# Patient Record
Sex: Male | Born: 1955 | Hispanic: Yes | Marital: Married | Smoking: Former smoker
Health system: Southern US, Community
[De-identification: ages and names within clinical notes are randomized; demographics above are authoritative.]

## PROBLEM LIST (undated history)

## (undated) DIAGNOSIS — M170 Bilateral primary osteoarthritis of knee: Secondary | ICD-10-CM

## (undated) HISTORY — PX: KNEE SURGERY: SHX244

---

## 2019-04-08 ENCOUNTER — Encounter (HOSPITAL_COMMUNITY): Payer: Self-pay

## 2019-04-08 ENCOUNTER — Emergency Department (HOSPITAL_COMMUNITY): Payer: Self-pay

## 2019-04-08 ENCOUNTER — Other Ambulatory Visit: Payer: Self-pay

## 2019-04-08 ENCOUNTER — Inpatient Hospital Stay (HOSPITAL_COMMUNITY)
Admission: EM | Admit: 2019-04-08 | Discharge: 2019-04-14 | DRG: 872 | Disposition: A | Discharge status: Home Health | Payer: Self-pay | Attending: Family Medicine | Admitting: Family Medicine

## 2019-04-08 DIAGNOSIS — M729 Fibroblastic disorder, unspecified: Secondary | ICD-10-CM | POA: Diagnosis present

## 2019-04-08 DIAGNOSIS — M1990 Unspecified osteoarthritis, unspecified site: Secondary | ICD-10-CM | POA: Diagnosis present

## 2019-04-08 DIAGNOSIS — Z1159 Encounter for screening for other viral diseases: Secondary | ICD-10-CM

## 2019-04-08 DIAGNOSIS — D649 Anemia, unspecified: Secondary | ICD-10-CM | POA: Diagnosis present

## 2019-04-08 DIAGNOSIS — R748 Abnormal levels of other serum enzymes: Secondary | ICD-10-CM | POA: Diagnosis present

## 2019-04-08 DIAGNOSIS — Z945 Skin transplant status: Secondary | ICD-10-CM

## 2019-04-08 DIAGNOSIS — M609 Myositis, unspecified: Secondary | ICD-10-CM | POA: Diagnosis present

## 2019-04-08 DIAGNOSIS — L538 Other specified erythematous conditions: Secondary | ICD-10-CM

## 2019-04-08 DIAGNOSIS — T63301A Toxic effect of unspecified spider venom, accidental (unintentional), initial encounter: Secondary | ICD-10-CM | POA: Diagnosis present

## 2019-04-08 DIAGNOSIS — I96 Gangrene, not elsewhere classified: Secondary | ICD-10-CM | POA: Diagnosis present

## 2019-04-08 DIAGNOSIS — L03115 Cellulitis of right lower limb: Secondary | ICD-10-CM | POA: Diagnosis present

## 2019-04-08 DIAGNOSIS — Z87891 Personal history of nicotine dependence: Secondary | ICD-10-CM

## 2019-04-08 DIAGNOSIS — M7989 Other specified soft tissue disorders: Secondary | ICD-10-CM

## 2019-04-08 DIAGNOSIS — A419 Sepsis, unspecified organism: Principal | ICD-10-CM | POA: Diagnosis present

## 2019-04-08 HISTORY — DX: Bilateral primary osteoarthritis of knee: M17.0

## 2019-04-08 LAB — COMPREHENSIVE METABOLIC PANEL
ALT: 61 U/L — ABNORMAL HIGH (ref 0–44)
AST: 66 U/L — ABNORMAL HIGH (ref 15–41)
Albumin: 3.3 g/dL — ABNORMAL LOW (ref 3.5–5.0)
Alkaline Phosphatase: 154 U/L — ABNORMAL HIGH (ref 38–126)
Anion gap: 9 (ref 5–15)
BUN: 37 mg/dL — ABNORMAL HIGH (ref 8–23)
CO2: 26 mmol/L (ref 22–32)
Calcium: 8.5 mg/dL — ABNORMAL LOW (ref 8.9–10.3)
Chloride: 103 mmol/L (ref 98–111)
Creatinine, Ser: 1.01 mg/dL (ref 0.61–1.24)
GFR calc Af Amer: >60 mL/min (ref >60)
GFR calc non Af Amer: >60 mL/min (ref >60)
Glucose, Bld: 92 mg/dL (ref 70–99)
Potassium: 3.9 mmol/L (ref 3.5–5.1)
Sodium: 138 mmol/L (ref 135–145)
Total Bilirubin: 0.6 mg/dL (ref 0.3–1.2)
Total Protein: 7.7 g/dL (ref 6.5–8.1)

## 2019-04-08 LAB — CBC WITH DIFFERENTIAL/PLATELET
Abs Immature Granulocytes: 0.26 10*3/uL — ABNORMAL HIGH (ref 0.00–0.07)
Basophils Absolute: 0.2 10*3/uL — ABNORMAL HIGH (ref 0.0–0.1)
Basophils Relative: 0 %
Eosinophils Absolute: 0 10*3/uL (ref 0.0–0.5)
Eosinophils Relative: 0 %
HCT: 41.2 % (ref 39.0–52.0)
Hemoglobin: 13.2 g/dL (ref 13.0–17.0)
Immature Granulocytes: 1 %
Lymphocytes Relative: 2 %
Lymphs Abs: 0.8 10*3/uL (ref 0.7–4.0)
MCH: 29.9 pg (ref 26.0–34.0)
MCHC: 32 g/dL (ref 30.0–36.0)
MCV: 93.2 fL (ref 80.0–100.0)
Monocytes Absolute: 1.3 10*3/uL — ABNORMAL HIGH (ref 0.1–1.0)
Monocytes Relative: 4 %
Neutro Abs: 34.6 10*3/uL — ABNORMAL HIGH (ref 1.7–7.7)
Neutrophils Relative %: 93 %
Platelets: 348 10*3/uL (ref 150–400)
RBC: 4.42 MIL/uL (ref 4.22–5.81)
RDW: 14.6 % (ref 11.5–15.5)
WBC: 37.2 10*3/uL — ABNORMAL HIGH (ref 4.0–10.5)
nRBC: 0 % (ref 0.0–0.2)

## 2019-04-08 LAB — LACTIC ACID, PLASMA: Lactic Acid, Venous: 1.5 mmol/L (ref 0.5–1.9)

## 2019-04-08 LAB — SARS CORONAVIRUS 2 BY RT PCR (HOSPITAL ORDER, PERFORMED IN ~~LOC~~ HOSPITAL LAB): SARS Coronavirus 2: NEGATIVE

## 2019-04-08 MED ORDER — VANCOMYCIN HCL IN DEXTROSE 750-5 MG/150ML-% IV SOLN
750.0000 mg | Freq: Two times a day (BID) | INTRAVENOUS | Status: DC
  Administered 2019-04-09 – 2019-04-12 (×7): 750 mg via INTRAVENOUS
  Filled 2019-04-08 (×7): qty 150

## 2019-04-08 MED ORDER — SODIUM CHLORIDE 0.9 % IV SOLN
INTRAVENOUS | Status: AC
  Administered 2019-04-08: 22:00:00 via INTRAVENOUS

## 2019-04-08 MED ORDER — VANCOMYCIN HCL 10 G IV SOLR
1250.0000 mg | Freq: Once | INTRAVENOUS | Status: AC
  Administered 2019-04-08: 1250 mg via INTRAVENOUS
  Filled 2019-04-08: qty 1250

## 2019-04-08 MED ORDER — ONDANSETRON HCL 4 MG PO TABS: 4.0000 mg | ORAL_TABLET | Freq: Four times a day (QID) | ORAL | Status: DC | PRN

## 2019-04-08 MED ORDER — OXYCODONE HCL 5 MG PO TABS
5.0000 mg | ORAL_TABLET | ORAL | Status: DC | PRN
  Administered 2019-04-08 – 2019-04-14 (×4): 5 mg via ORAL
  Filled 2019-04-08 (×4): qty 1

## 2019-04-08 MED ORDER — ONDANSETRON HCL 4 MG/2ML IJ SOLN: 4.0000 mg | Freq: Four times a day (QID) | INTRAMUSCULAR | Status: DC | PRN

## 2019-04-08 MED ORDER — PIPERACILLIN-TAZOBACTAM 3.375 G IVPB
3.3750 g | Freq: Three times a day (TID) | INTRAVENOUS | Status: DC
  Administered 2019-04-09 – 2019-04-13 (×14): 3.375 g via INTRAVENOUS
  Filled 2019-04-08 (×14): qty 50

## 2019-04-08 MED ORDER — ACETAMINOPHEN 325 MG PO TABS
650.0000 mg | ORAL_TABLET | Freq: Four times a day (QID) | ORAL | Status: DC | PRN
  Administered 2019-04-09 – 2019-04-12 (×3): 650 mg via ORAL
  Filled 2019-04-08 (×3): qty 2

## 2019-04-08 MED ORDER — ACETAMINOPHEN 650 MG RE SUPP: 650.0000 mg | Freq: Four times a day (QID) | RECTAL | Status: DC | PRN

## 2019-04-08 MED ORDER — SODIUM CHLORIDE 0.9 % IV BOLUS
1000.0000 mL | Freq: Once | INTRAVENOUS | Status: AC
  Administered 2019-04-08: 15:00:00 1000 mL via INTRAVENOUS

## 2019-04-08 MED ORDER — GADOBUTROL 1 MMOL/ML IV SOLN
6.0000 mL | Freq: Once | INTRAVENOUS | Status: AC | PRN
  Administered 2019-04-08: 17:00:00 6 mL via INTRAVENOUS

## 2019-04-08 MED ORDER — PIPERACILLIN-TAZOBACTAM 3.375 G IVPB 30 MIN
3.3750 g | INTRAVENOUS | Status: AC
  Administered 2019-04-08: 3.375 g via INTRAVENOUS
  Filled 2019-04-08: qty 50

## 2019-04-08 NOTE — ED Notes (Signed)
Pt has IV from EMS that flushes well but unable to draw back blood. 2 unsuccessful attempts to obtain second IV.

## 2019-04-08 NOTE — ED Notes (Signed)
Vascular at beside.

## 2019-04-08 NOTE — ED Notes (Signed)
Called transport a second time, was told she would be here in a few

## 2019-04-08 NOTE — ED Triage Notes (Signed)
Pt BIB EMS from home. Per EMS spider bite on right leg occurred on Saturday. Pts leg is bright red and there is necrotic tissue. Pt is Spanish speaking.

## 2019-04-08 NOTE — ED Notes (Signed)
Patient transported to MRI 

## 2019-04-08 NOTE — Progress Notes (Signed)
Pharmacy Antibiotic Note  Evan Gray is a 63 y.o. male admitted on 04/08/2019 with wound infection.  Pharmacy has been consulted for zosyn and vancomycin dosing.  Plan: Zosyn 3.375g IV q8h (4 hour infusion).  Vancomycin 1250 mg x1 then 750 mg IV q12h for est AUC = 512 Goal AUC = 400-550 Daily scr F/u cultures/levels  Height: 5\' 6"  (167.6 cm) Weight: 150 lb (68 kg) IBW/kg (Calculated) : 63.8  Temp (24hrs), Avg:97.8 F (36.6 C), Min:97.8 F (36.6 C), Max:97.8 F (36.6 C)  Recent Labs  Lab 04/08/19 1437  WBC 37.2*  CREATININE 1.01  LATICACIDVEN 1.5    Estimated Creatinine Clearance: 66.7 mL/min (by C-G formula based on SCr of 1.01 mg/dL).    No Known Allergies  Antimicrobials this admission: 6/25 zosyn >>  6/25 vancomycin >>   Dose adjustments this admission:   Microbiology results:  BCx:   UCx:    Sputum:    MRSA PCR:   Thank you for allowing pharmacy to be a part of this patient's care.  Dorrene German 04/08/2019 9:43 PM

## 2019-04-08 NOTE — ED Notes (Signed)
Patient transported to X-ray 

## 2019-04-08 NOTE — Progress Notes (Signed)
A consult was received from an ED physician for vancomycin per pharmacy dosing.  The patient's profile has been reviewed for ht/wt/allergies/indication/available labs.    A one time order has been placed for vancomycin 1250 mg IV x1 .  Further antibiotics/pharmacy consults should be ordered by admitting physician if indicated.                       Thank you, Lynelle Doctor 04/08/2019  1:34 PM

## 2019-04-08 NOTE — ED Provider Notes (Signed)
Care assumed from [facility], patient initially cared for by PA Katherine MantleBrenda Morelli, please see his note for full details, but in brief Evan CanterburyValera Gray is a 63 y.o. male presenting with cellulitis and wound to the right lower extremity after suspected insect bite.  Patient with significant leukocytosis, no acute electrolyte derangements, LFTs slightly elevated, normal lactic.  X-ray of the right lower extremity and DVT study were negative, MRI ordered by previous provider which is pending.  Patient has been given IV fluids and vancomycin.  Plan: Follow-up on MRI results, plan for admission.  Physical Exam  BP 110/71    Pulse 80    Temp 97.8 F (36.6 C) (Oral)    Resp (!) 24    Ht 5\' 6"  (1.676 m)    Wt 68 kg    SpO2 97%    BMI 24.21 kg/m   Physical Exam       ED Course/Procedures   Labs Reviewed  CBC WITH DIFFERENTIAL/PLATELET - Abnormal; Notable for the following components:      Result Value   WBC 37.2 (*)    Neutro Abs 34.6 (*)    Monocytes Absolute 1.3 (*)    Basophils Absolute 0.2 (*)    Abs Immature Granulocytes 0.26 (*)    All other components within normal limits  COMPREHENSIVE METABOLIC PANEL - Abnormal; Notable for the following components:   BUN 37 (*)    Calcium 8.5 (*)    Albumin 3.3 (*)    AST 66 (*)    ALT 61 (*)    Alkaline Phosphatase 154 (*)    All other components within normal limits  CULTURE, BLOOD (ROUTINE X 2)  CULTURE, BLOOD (ROUTINE X 2)  SARS CORONAVIRUS 2 (HOSPITAL ORDER, PERFORMED IN [facility])  LACTIC ACID, PLASMA    Dg Tibia/fibula Right  Result Date: 04/08/2019 CLINICAL DATA:  Pain and swelling. EXAM: RIGHT TIBIA AND FIBULA - 2 VIEW COMPARISON:  None. FINDINGS: Advanced degenerative changes at the knee joint are noted. Evidence of remote fractures of the tibia and fibula with prior hardware removal. No acute bony findings, destructive bony changes or radiopaque foreign body. IMPRESSION: Degenerative changes and evidence of  remote trauma and surgery. No acute findings. Electronically Signed   By: Rudie MeyerP.  Gallerani M.D.   On: 04/08/2019 13:56   Mr Tibia Fibula Right W Wo Contrast  Result Date: 04/08/2019 CLINICAL DATA:  Lower leg redness and swelling after spider bite. EXAM: MRI OF LOWER RIGHT EXTREMITY WITHOUT AND WITH CONTRAST TECHNIQUE: Multiplanar, multisequence MR imaging of the right tibia and fibula was performed both before and after administration of intravenous contrast. CONTRAST:  6 mL Gadavist intravenous contrast. COMPARISON:  Right tibia and fibula x-rays from same day. FINDINGS: Bones/Joint/Cartilage No suspicious marrow signal abnormality. Advanced bilateral knee osteoarthritis, most severe in the right lateral compartment, with prominent subchondral marrow edema. Multiple intra-articular bodies in the posterior right knee joint space. No acute fracture or dislocation. Old healed fractures of the right proximal tibial metaphysis and proximal fibular diaphysis. Normal alignment. Small left and trace right knee joint effusions. Muscles and Tendons Edema and enhancement primarily involving the medial gastrocnemius muscle, and to lesser extent the lateral gastrocnemius muscle and anterior aspect of the soleus muscle. Prominent inter fascial fluid between the medial gastrocnemius and soleus muscles extending superiorly into the popliteal fossa. Soft tissue Extensive diffuse soft tissue swelling and enhancement of the lower leg. There are patchy areas of non-enhancement along the medial distal lower leg (  series 10, image 41), consistent with devitalized tissue. No discrete rim enhancing fluid collection. No soft tissue mass. IMPRESSION: 1. Extensive cellulitis of the right lower leg with underlying myofasciitis involving the superficial posterior compartment. Fascial fluid extends superiorly into the popliteal fossa. Areas of devitalized superficial soft tissue along the medial lower leg without discrete abscess. 2. No  evidence of osteomyelitis. 3. Advanced bilateral knee osteoarthritis. Electronically Signed   By: Obie DredgeWilliam T Derry M.D.   On: 04/08/2019 17:44   Vas Koreas Lower Extremity Venous (dvt) (only Mc & [facility])  Result Date: 04/08/2019  Lower Venous Study Indications: Right sided Swelling, Erythema, and Post spider bite 5 days ago. Necrotic tissue in the calf area.  Limitations: Open wound. Comparison Study: No previous exam available Performing Technologist: Toma DeitersVirginia Slaughter RVS  Examination Guidelines: A complete evaluation includes B-mode imaging, spectral Doppler, color Doppler, and power Doppler as needed of all accessible portions of each vessel. Bilateral testing is considered an integral part of a complete examination. Limited examinations for reoccurring indications may be performed as noted.  +---------+---------------+---------+-----------+----------+-------------------+  RIGHT     Compressibility Phasicity Spontaneity Properties Summary              +---------+---------------+---------+-----------+----------+-------------------+  CFV       Full            Yes       Yes                                         +---------+---------------+---------+-----------+----------+-------------------+  SFJ       Full                                                                  +---------+---------------+---------+-----------+----------+-------------------+  FV Prox   Full            Yes       Yes                                         +---------+---------------+---------+-----------+----------+-------------------+  FV Mid    Full                                                                  +---------+---------------+---------+-----------+----------+-------------------+  FV Distal Full            Yes       Yes                                         +---------+---------------+---------+-----------+----------+-------------------+  PFV       Full            Yes       Yes                                          +---------+---------------+---------+-----------+----------+-------------------+  POP       Full            Yes       Yes                                         +---------+---------------+---------+-----------+----------+-------------------+  PTV       Full                                                                  +---------+---------------+---------+-----------+----------+-------------------+  PERO      Full                                             Unable to visualize                                                              the proximal region  +---------+---------------+---------+-----------+----------+-------------------+   Right Technical Findings: Not visualized segments include proximal PTV and proximal Peroneal. Unable to visualize due to open wound in the mid calf at the site of the necrotic tissue.  +----+---------------+---------+-----------+----------+-------+  LEFT Compressibility Phasicity Spontaneity Properties Summary  +----+---------------+---------+-----------+----------+-------+  CFV  Full            Yes       Yes                             +----+---------------+---------+-----------+----------+-------+  SFJ  Full                                                      +----+---------------+---------+-----------+----------+-------+     Summary: Right: There is no evidence of deep vein thrombosis in the lower extremity. However, portions of this examination were limited- see technical findings listed above. Left: There is no evidence of a common femoral vein obstruction.  *See table(s) above for measurements and observations.    Preliminary      Procedures  MDM   MRI shows extensive cellulitis of the right lower leg with underlying myofasciitis involving the superficial posterior compartment. Fascial fluid extends superiorly into the popliteal fossa. Areas of devitalized superficial soft tissue along the medial lower leg without discrete abscess. No evidence of osteomyelitis.  I  discussed these results with Dr. Nelia Shi, radiologist who read images and he does express concern for deeper infection within the muscle and fascia, will discuss with orthopedics for possible surgical intervention.  Case discussed with Dr. Erlinda Hong with orthopedics who reviewed patient's images, does not feel that patient needs acute surgical intervention at this time, recommends hospitalist admission continued IV antibiotics, if patient is not responding to antibiotics, orthopedics can be reconsulted at that  time.  Case discussed with Dr. Allena KatzPatel with [facility] hospitalist who will see and admit the patient.  Final diagnoses:  Cellulitis of right lower extremity  Myofasciitis         Legrand RamsFord, Derhonda Eastlick N, PA-C 04/08/19 1913    Vanetta MuldersZackowski, Scott, MD 04/11/19 [phone]
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
## 2019-04-08 NOTE — Progress Notes (Signed)
Right lower extremity venous duplex completed. Preliminary results in Chart review CV Proc. [facility], [place]  04/08/2019, 3:22 PM

## 2019-04-08 NOTE — ED Notes (Signed)
Pt back from MRI 

## 2019-04-08 NOTE — ED Notes (Signed)
ED TO INPATIENT HANDOFF REPORT  Name/Age/Gender Evan Gray 63 y.o. male  Code Status   Home/SNF/Other Home  Chief Complaint Spider Bite  Level of Care/Admitting Diagnosis ED Disposition    ED Disposition Condition [facility] Area: [place] [[id number]]  Level of Care: Med-Surg [16]  Covid Evaluation: Confirmed COVID Negative  Diagnosis: Cellulitis of right lower extremity [563149]  Admitting Physician: Lenore Cordia [[id number]]  Attending Physician: Lenore Cordia [[id number]]  Estimated length of stay: past midnight tomorrow  Certification:: I certify this patient will need inpatient services for at least 2 midnights  PT Class (Do Not Modify): Inpatient [101]  PT Acc Code (Do Not Modify): Private [1]       Medical History Past Medical History:  Diagnosis Date  . Osteoarthritis of knees, bilateral     Allergies No Known Allergies  IV Location/Drains/Wounds Patient Lines/Drains/Airways Status   Active Line/Drains/Airways    Name:   Placement date:   Placement time:   Site:   Days:   Peripheral IV 04/08/19 Right Forearm   04/08/19    1436    Forearm   less than 1   Peripheral IV 04/08/19 Right;Upper Arm   04/08/19    1441    Arm   less than 1          Labs/Imaging Results for orders placed or performed during the hospital encounter of 04/08/19 (from the past 48 hour(s))  CBC with Differential     Status: Abnormal   Collection Time: 04/08/19  2:37 PM  Result Value Ref Range   WBC 37.2 (H) 4.0 - 10.5 K/uL   RBC 4.42 4.22 - 5.81 MIL/uL   Hemoglobin 13.2 13.0 - 17.0 g/dL   HCT 41.2 39.0 - 52.0 %   MCV 93.2 80.0 - 100.0 fL   MCH 29.9 26.0 - 34.0 pg   MCHC 32.0 30.0 - 36.0 g/dL   RDW 14.6 11.5 - 15.5 %   Platelets 348 150 - 400 K/uL   nRBC 0.0 0.0 - 0.2 %   Neutrophils Relative % 93 %   Neutro Abs 34.6 (H) 1.7 - 7.7 K/uL   Lymphocytes Relative 2 %   Lymphs Abs 0.8 0.7 - 4.0 K/uL   Monocytes Relative 4 %   Monocytes  Absolute 1.3 (H) 0.1 - 1.0 K/uL   Eosinophils Relative 0 %   Eosinophils Absolute 0.0 0.0 - 0.5 K/uL   Basophils Relative 0 %   Basophils Absolute 0.2 (H) 0.0 - 0.1 K/uL   Immature Granulocytes 1 %   Abs Immature Granulocytes 0.26 (H) 0.00 - 0.07 K/uL    Comment: Performed at [facility], [place] [address], [place], [place] [address]  Comprehensive metabolic panel     Status: Abnormal   Collection Time: 04/08/19  2:37 PM  Result Value Ref Range   Sodium 138 135 - 145 mmol/L   Potassium 3.9 3.5 - 5.1 mmol/L   Chloride 103 98 - 111 mmol/L   CO2 26 22 - 32 mmol/L   Glucose, Bld 92 70 - 99 mg/dL   BUN 37 (H) 8 - 23 mg/dL   Creatinine, Ser 1.01 0.61 - 1.24 mg/dL   Calcium 8.5 (L) 8.9 - 10.3 mg/dL   Total Protein 7.7 6.5 - 8.1 g/dL   Albumin 3.3 (L) 3.5 - 5.0 g/dL   AST 66 (H) 15 - 41 U/L   ALT 61 (H) 0 - 44 U/L   Alkaline  Phosphatase 154 (H) 38 - 126 U/L   Total Bilirubin 0.6 0.3 - 1.2 mg/dL   GFR calc non Af Amer >60 >60 mL/min   GFR calc Af Amer >60 >60 mL/min   Anion gap 9 5 - 15    Comment: Performed at [facility], [address] [place], [place] [address]  Lactic acid, plasma     Status: None   Collection Time: 04/08/19  2:37 PM  Result Value Ref Range   Lactic Acid, Venous 1.5 0.5 - 1.9 mmol/L    Comment: Performed at [facility], [address] [place], [place] [address]  Blood culture (routine x 2)     Status: None (Preliminary result)   Collection Time: 04/08/19  2:39 PM   Specimen: BLOOD RIGHT FOREARM  Result Value Ref Range   Specimen Description      BLOOD RIGHT FOREARM Performed at [facility], [address] [place], [place] [address]    Special Requests      BOTTLES DRAWN AEROBIC AND ANAEROBIC Blood Culture adequate volume Performed at [facility], [address] [place], [place] [address]    Culture PENDING    Report Status PENDING   SARS Coronavirus 2 (CEPHEID -  Performed in [facility] hospital lab), Hosp Order     Status: None   Collection Time: 04/08/19  6:04 PM   Specimen: Nasopharyngeal Swab  Result Value Ref Range   SARS Coronavirus 2 NEGATIVE NEGATIVE    Comment: (NOTE) If result is NEGATIVE SARS-CoV-2 target nucleic acids are NOT DETECTED. The SARS-CoV-2 RNA is generally detectable in upper and lower  respiratory specimens during the acute phase of infection. The lowest  concentration of SARS-CoV-2 viral copies this assay can detect is 250  copies / mL. A negative result does not preclude SARS-CoV-2 infection  and should not be used as the sole basis for treatment or other  patient management decisions.  A negative result may occur with  improper specimen collection / handling, submission of specimen other  than nasopharyngeal swab, presence of viral mutation(s) within the  areas targeted by this assay, and inadequate number of viral copies  (<250 copies / mL). A negative result must be combined with clinical  observations, patient history, and epidemiological information. If result is POSITIVE SARS-CoV-2 target nucleic acids are DETECTED. The SARS-CoV-2 RNA is generally detectable in upper and lower  respiratory specimens dur ing the acute phase of infection.  Positive  results are indicative of active infection with SARS-CoV-2.  Clinical  correlation with patient history and other diagnostic information is  necessary to determine patient infection status.  Positive results do  not rule out bacterial infection or co-infection with other viruses. If result is PRESUMPTIVE POSTIVE SARS-CoV-2 nucleic acids MAY BE PRESENT.   A presumptive positive result was obtained on the submitted specimen  and confirmed on repeat testing.  While 2019 novel coronavirus  (SARS-CoV-2) nucleic acids may be present in the submitted sample  additional confirmatory testing may be necessary for epidemiological  and / or clinical management purposes  to  differentiate between  SARS-CoV-2 and other Sarbecovirus currently known to infect humans.  If clinically indicated additional testing with an alternate test  methodology [phone]([id number]) is advised. The SARS-CoV-2 RNA is generally  detectable in upper and lower respiratory sp ecimens during the acute  phase of infection. The expected result is Negative. Fact Sheet for Patients:  BoilerBrush.com.cyhttps://www.fda.gov/media/136312/download Fact Sheet for Healthcare Providers: https://pope.com/https://www.fda.gov/media/136313/download  This test is not yet approved or cleared by the [place] and has been authorized for detection and/or diagnosis of SARS-CoV-2 by FDA under an Emergency Use Authorization (EUA).  This EUA will remain in effect (meaning this test can be used) for the duration of the COVID-19 declaration under Section 564(b)(1) of the Act, 21 U.S.C. section 360bbb-3(b)(1), unless the authorization is terminated or revoked sooner. Performed at [facility], [address] [place], [place] [address]    Dg Tibia/fibula Right  Result Date: 04/08/2019 CLINICAL DATA:  Pain and swelling. EXAM: RIGHT TIBIA AND FIBULA - 2 VIEW COMPARISON:  None. FINDINGS: Advanced degenerative changes at the knee joint are noted. Evidence of remote fractures of the tibia and fibula with prior hardware removal. No acute bony findings, destructive bony changes or radiopaque foreign body. IMPRESSION: Degenerative changes and evidence of remote trauma and surgery. No acute findings. Electronically Signed   By: Rudie Meyer M.D.   On: 04/08/2019 13:56   Mr Tibia Fibula Right W Wo Contrast  Result Date: 04/08/2019 CLINICAL DATA:  Lower leg redness and swelling after spider bite. EXAM: MRI OF LOWER RIGHT EXTREMITY WITHOUT AND WITH CONTRAST TECHNIQUE: Multiplanar, multisequence MR imaging of the right tibia and fibula was performed both before and after administration of intravenous contrast. CONTRAST:  6 mL Gadavist  intravenous contrast. COMPARISON:  Right tibia and fibula x-rays from same day. FINDINGS: Bones/Joint/Cartilage No suspicious marrow signal abnormality. Advanced bilateral knee osteoarthritis, most severe in the right lateral compartment, with prominent subchondral marrow edema. Multiple intra-articular bodies in the posterior right knee joint space. No acute fracture or dislocation. Old healed fractures of the right proximal tibial metaphysis and proximal fibular diaphysis. Normal alignment. Small left and trace right knee joint effusions. Muscles and Tendons Edema and enhancement primarily involving the medial gastrocnemius muscle, and to lesser extent the lateral gastrocnemius muscle and anterior aspect of the soleus muscle. Prominent inter fascial fluid between the medial gastrocnemius and soleus muscles extending superiorly into the popliteal fossa. Soft tissue Extensive diffuse soft tissue swelling and enhancement of the lower leg. There are patchy areas of non-enhancement along the medial distal lower leg (series 10, image 41), consistent with devitalized tissue. No discrete rim enhancing fluid collection. No soft tissue mass. IMPRESSION: 1. Extensive cellulitis of the right lower leg with underlying myofasciitis involving the superficial posterior compartment. Fascial fluid extends superiorly into the popliteal fossa. Areas of devitalized superficial soft tissue along the medial lower leg without discrete abscess. 2. No evidence of osteomyelitis. 3. Advanced bilateral knee osteoarthritis. Electronically Signed   By: Obie Dredge M.D.   On: 04/08/2019 17:44   Vas [place] Lower Extremity Venous (dvt) (only [facility])  Result Date: 04/08/2019  Lower Venous Study Indications: Right sided Swelling, Erythema, and Post spider bite 5 days ago. Necrotic tissue in the calf area.  Limitations: Open wound. Comparison Study: No previous exam available Performing Technologist: Toma Deiters RVS  Examination  Guidelines: A complete evaluation includes B-mode imaging, spectral Doppler, color Doppler, and power Doppler as needed of all accessible portions of each vessel. Bilateral testing is considered an integral part of a complete examination. Limited examinations for reoccurring indications may be performed as noted.  +---------+---------------+---------+-----------+----------+-------------------+ RIGHT    CompressibilityPhasicitySpontaneityPropertiesSummary             +---------+---------------+---------+-----------+----------+-------------------+ CFV      Full           Yes      Yes                                      +---------+---------------+---------+-----------+----------+-------------------+  SFJ      Full                                                             +---------+---------------+---------+-----------+----------+-------------------+ FV Prox  Full           Yes      Yes                                      +---------+---------------+---------+-----------+----------+-------------------+ FV Mid   Full                                                             +---------+---------------+---------+-----------+----------+-------------------+ FV DistalFull           Yes      Yes                                      +---------+---------------+---------+-----------+----------+-------------------+ PFV      Full           Yes      Yes                                      +---------+---------------+---------+-----------+----------+-------------------+ POP      Full           Yes      Yes                                      +---------+---------------+---------+-----------+----------+-------------------+ PTV      Full                                                             +---------+---------------+---------+-----------+----------+-------------------+ PERO     Full                                         Unable to visualize                                                        the proximal region +---------+---------------+---------+-----------+----------+-------------------+   Right Technical Findings: Not visualized segments include proximal PTV and proximal Peroneal. Unable to visualize due to open wound in the mid calf at the site of the necrotic tissue.  +----+---------------+---------+-----------+----------+-------+ LEFTCompressibilityPhasicitySpontaneityPropertiesSummary +----+---------------+---------+-----------+----------+-------+ CFV Full           Yes  Yes                          +----+---------------+---------+-----------+----------+-------+ SFJ Full                                                 +----+---------------+---------+-----------+----------+-------+     Summary: Right: There is no evidence of deep vein thrombosis in the lower extremity. However, portions of this examination were limited- see technical findings listed above. Left: There is no evidence of a common femoral vein obstruction.  *See table(s) above for measurements and observations.    Preliminary     Pending Labs Unresulted Labs (From admission, onward)    Start     Ordered   04/08/19 1302  Blood culture (routine x 2)  BLOOD CULTURE X 2,   STAT     04/08/19 1303          Vitals/Pain Today's Vitals   04/08/19 1830 04/08/19 1900 04/08/19 1930 04/08/19 2041  BP: 104/76 111/70 103/68 106/68  Pulse: 82 81 82 92  Resp: (!) 21 (!) 25 (!) 22 (!) 26  Temp:      TempSrc:      SpO2: 99% 97% 97% 97%  Weight:      Height:      PainSc:        Isolation Precautions No active isolations  Medications Medications  sodium chloride 0.9 % bolus 1,000 mL (0 mLs Intravenous Stopped 04/08/19 1705)  vancomycin (VANCOCIN) 1,250 mg in sodium chloride 0.9 % 250 mL IVPB (0 mg Intravenous Stopped 04/08/19 1759)  gadobutrol (GADAVIST) 1 MMOL/ML injection 6 mL (6 mLs Intravenous Contrast Given 04/08/19 1701)    Mobility walks

## 2019-04-08 NOTE — ED Provider Notes (Signed)
I assumed care of patient at shift change, please see note by Nuala Alpha PA-C for full H and P.    Briefly patient is here for evaluation of a spider bite to his leg.  He squished a spider before he was able to see what it was.  He has had fever 3 days ago however none today.    Physical Exam  BP 112/76   Pulse 75   Temp 97.8 F (36.6 C) (Oral)   Resp (!) 24   Ht 5\' 6"  (1.676 m)   Wt 68 kg   SpO2 99%   BMI 24.21 kg/m   Physical Exam: per Moshe Salisbury    ED Course/Procedures     Procedures  MDM   Plan to follow up on MRI and admit.  He is being treated with Vanc.  DVT negative. White count is markedly elevated, mild tachypnea.  Lactic is normal.  Blood cultures obtained.    Patient was in MRI when I attempted to see him before the end of my shift.  Patient was not seen by me.  Patient care signed out [facility].      Lorin Glass, PA-C 04/08/19 1721    Virgel Manifold, MD 04/11/19 [phone]

## 2019-04-08 NOTE — H&P (Signed)
History and Physical    Evan Gray OLM:[id number] DOB: [birth year] DOA: 04/08/2019  PCP: Patient, No Pcp Per  Patient coming from: Home  I have personally briefly reviewed patient's old medical records in [place]  Chief Complaint: Right leg wound after spider bite  HPI: Evan Gray is a Spanish-speaking  63 y.o. male with medical history significant for bilateral knee osteoarthritis who presents to the ED for evaluation of right lower leg infection after a spider bite.  Teleinterpreter is used to facilitate communication.  Patient states he was bit by a spider on his right lower extremity above the ankle on 04/03/2019.  As soon as he felt the bite he smashed a spider with his hand and was unable to see what kind of spider bit him.  Since that time he has progressive swelling, erythema, and pain of his right lower extremity.  He developed blistering and open serosanguineous drainage.  He says he went to a [place] store and took over-the-counter medicine containing diclofenac for pain.  His pain continued to worsen today, he came to the ED for further evaluation.  He reports associated intermittent fevers, chills, and diaphoresis. He denies any chest pain, dyspnea, abdominal pain.  He reports a history of smoking, quit 40 years ago.  He denies any alcohol use in the last 5 years.  He denies any illicit drug use.  He denies any known medical conditions in his immediate family.  ED Course:  Initial vitals showed BP 111/72, pulse 78, RR 27, temp 97.8 Fahrenheit, SPO2 99% on room air.  Labs are notable for WBC 37.2, hemoglobin 13.2, platelets 348,000, BUN 37, creatinine 1.01, AST 66, ALT 61, alk phos 154, potassium 3.9, lactic acid 1.5.  Blood cultures were obtained and pending.  SARS-CoV-2 test is negative.  X-ray of the right tibia/fibula showed degenerative changes and evidence of remote trauma and surgery without acute findings.  Doppler of the right and left lower extremity  preliminary read is negative for evidence of DVT.  MRI of the right tibia/fibula with and without contrast showed extensive cellulitis of the right lower extremity with underlying myofasciitis involving the superficial posterior compartment.  Fascial fluid extend superiorly into the popliteal fossa.  Areas of devitalized superficial soft tissue are seen along the medial lower leg without discrete abscess.  No evidence of osteomyelitis.  Patient was given 1 L normal saline and IV vancomycin.  Orthopedics on-call for consulted by the EDP who reviewed the images and did not feel acute surgical intervention was warranted at this time.  It was recommended that patient be admitted to the hospitalist service for IV antibiotics and reconsult orthopedics if patient is not responding.  The hospitalist service was consulted to admit for further evaluation and management.  Review of Systems: All systems reviewed and are negative except as documented in history of present illness above.   Past Medical History:  Diagnosis Date   Osteoarthritis of knees, bilateral     Past Surgical History:  Procedure Laterality Date   KNEE SURGERY      Social History:  reports that he has quit smoking. He has never used smokeless tobacco. He reports previous alcohol use. He reports that he does not use drugs.  No Known Allergies  Family History  Family history unknown: Yes     Prior to Admission medications   Medication Sig Start Date End Date Taking? Authorizing Provider  OVER THE COUNTER MEDICATION Take 2 tablets by mouth 2 (two) times a day. Tablets  sold at the [employer] that contain Diclofenac and B vitamins.   Yes [provider]  Pediatric Multivit-Minerals-C (RA GUMMY VITAMINS & MINERALS PO) Take 2 tablets by mouth 2 (two) times a day.   Yes [provider]    Physical Exam: Vitals:   04/08/19 1900 04/08/19 1930 04/08/19 2041 04/08/19 2100  BP: 111/70 103/68 106/68 103/68    Pulse: 81 82 92 (!) 102  Resp: (!) 25 (!) 22 (!) 26 20  Temp:      TempSrc:      SpO2: 97% 97% 97% 97%  Weight:      Height:        Constitutional: Resting supine in bed, NAD, calm, comfortable Eyes: PERRL, lids and conjunctivae normal ENMT: Mucous membranes are moist. Posterior pharynx clear of any exudate or lesions. Neck: normal, supple, no masses. Respiratory: clear to auscultation bilaterally, no wheezing, no crackles. Normal respiratory effort. No accessory muscle use.  Cardiovascular: Regular rate and rhythm, no murmurs / rubs / gallops.  Significant edema right lower extremity as pictured below, no edema of LLE Abdomen: no tenderness, no masses palpated. No hepatosplenomegaly. Bowel sounds positive.  Musculoskeletal: no clubbing / cyanosis. No joint deformity upper and lower extremities. Good ROM. Skin: Significant edema, erythema, cellulitis and blistering of RLE as pictured below.  There is scant serosanguineous drainage. Neurologic: CN 2-12 grossly intact. Sensation intact, Strength 5/5 in all 4.  Psychiatric: Normal judgment and insight. Alert and oriented x 3. Normal mood.       Labs on Admission: I have personally reviewed following labs and imaging studies  CBC: Recent Labs  Lab 04/08/19 1437  WBC 37.2*  NEUTROABS 34.6*  HGB 13.2  HCT 41.2  MCV 93.2  PLT 030   Basic Metabolic Panel: Recent Labs  Lab 04/08/19 1437  NA 138  K 3.9  CL 103  CO2 26  GLUCOSE 92  BUN 37*  CREATININE 1.01  CALCIUM 8.5*   GFR: Estimated Creatinine Clearance: 66.7 mL/min (by C-G formula based on SCr of 1.01 mg/dL). Liver Function Tests: Recent Labs  Lab 04/08/19 1437  AST 66*  ALT 61*  ALKPHOS 154*  BILITOT 0.6  PROT 7.7  ALBUMIN 3.3*   No results for input(s): LIPASE, AMYLASE in the last 168 hours. No results for input(s): AMMONIA in the last 168 hours. Coagulation Profile: No results for input(s): INR, PROTIME in the last 168 hours. Cardiac Enzymes: No  results for input(s): CKTOTAL, CKMB, CKMBINDEX, TROPONINI in the last 168 hours. BNP (last 3 results) No results for input(s): PROBNP in the last 8760 hours. HbA1C: No results for input(s): HGBA1C in the last 72 hours. CBG: No results for input(s): GLUCAP in the last 168 hours. Lipid Profile: No results for input(s): CHOL, HDL, LDLCALC, TRIG, CHOLHDL, LDLDIRECT in the last 72 hours. Thyroid Function Tests: No results for input(s): TSH, T4TOTAL, FREET4, T3FREE, THYROIDAB in the last 72 hours. Anemia Panel: No results for input(s): VITAMINB12, FOLATE, FERRITIN, TIBC, IRON, RETICCTPCT in the last 72 hours. Urine analysis: No results found for: COLORURINE, APPEARANCEUR, [place], [place], GLUCOSEU, HGBUR, BILIRUBINUR, KETONESUR, PROTEINUR, UROBILINOGEN, NITRITE, LEUKOCYTESUR  Radiological Exams on Admission: Dg Tibia/fibula Right  Result Date: 04/08/2019 CLINICAL DATA:  Pain and swelling. EXAM: RIGHT TIBIA AND FIBULA - 2 VIEW COMPARISON:  None. FINDINGS: Advanced degenerative changes at the knee joint are noted. Evidence of remote fractures of the tibia and fibula with prior hardware removal. No acute bony findings, destructive bony changes or radiopaque foreign body.  IMPRESSION: Degenerative changes and evidence of remote trauma and surgery. No acute findings. Electronically Signed   By: Marijo Sanes M.D.   On: 04/08/2019 13:56   Mr Tibia Fibula Right W Wo Contrast  Result Date: 04/08/2019 CLINICAL DATA:  Lower leg redness and swelling after spider bite. EXAM: MRI OF LOWER RIGHT EXTREMITY WITHOUT AND WITH CONTRAST TECHNIQUE: Multiplanar, multisequence MR imaging of the right tibia and fibula was performed both before and after administration of intravenous contrast. CONTRAST:  6 mL Gadavist intravenous contrast. COMPARISON:  Right tibia and fibula x-rays from same day. FINDINGS: Bones/Joint/Cartilage No suspicious marrow signal abnormality. Advanced bilateral knee osteoarthritis, most severe in  the right lateral compartment, with prominent subchondral marrow edema. Multiple intra-articular bodies in the posterior right knee joint space. No acute fracture or dislocation. Old healed fractures of the right proximal tibial metaphysis and proximal fibular diaphysis. Normal alignment. Small left and trace right knee joint effusions. Muscles and Tendons Edema and enhancement primarily involving the medial gastrocnemius muscle, and to lesser extent the lateral gastrocnemius muscle and anterior aspect of the soleus muscle. Prominent inter fascial fluid between the medial gastrocnemius and soleus muscles extending superiorly into the popliteal fossa. Soft tissue Extensive diffuse soft tissue swelling and enhancement of the lower leg. There are patchy areas of non-enhancement along the medial distal lower leg (series 10, image 41), consistent with devitalized tissue. No discrete rim enhancing fluid collection. No soft tissue mass. IMPRESSION: 1. Extensive cellulitis of the right lower leg with underlying myofasciitis involving the superficial posterior compartment. Fascial fluid extends superiorly into the popliteal fossa. Areas of devitalized superficial soft tissue along the medial lower leg without discrete abscess. 2. No evidence of osteomyelitis. 3. Advanced bilateral knee osteoarthritis. Electronically Signed   By: Titus Dubin M.D.   On: 04/08/2019 17:44   Vas [place] Lower Extremity Venous (dvt) (only [facility])  Result Date: 04/08/2019  Lower Venous Study Indications: Right sided Swelling, Erythema, and Post spider bite 5 days ago. Necrotic tissue in the calf area.  Limitations: Open wound. Comparison Study: No previous exam available Performing Technologist: Toma Copier RVS  Examination Guidelines: A complete evaluation includes B-mode imaging, spectral Doppler, color Doppler, and power Doppler as needed of all accessible portions of each vessel. Bilateral testing is considered an integral part of a  complete examination. Limited examinations for reoccurring indications may be performed as noted.  +---------+---------------+---------+-----------+----------+-------------------+  RIGHT     Compressibility Phasicity Spontaneity Properties Summary              +---------+---------------+---------+-----------+----------+-------------------+  CFV       Full            Yes       Yes                                         +---------+---------------+---------+-----------+----------+-------------------+  SFJ       Full                                                                  +---------+---------------+---------+-----------+----------+-------------------+  FV Prox   Full            Yes  Yes                                         +---------+---------------+---------+-----------+----------+-------------------+  FV Mid    Full                                                                  +---------+---------------+---------+-----------+----------+-------------------+  FV Distal Full            Yes       Yes                                         +---------+---------------+---------+-----------+----------+-------------------+  PFV       Full            Yes       Yes                                         +---------+---------------+---------+-----------+----------+-------------------+  POP       Full            Yes       Yes                                         +---------+---------------+---------+-----------+----------+-------------------+  PTV       Full                                                                  +---------+---------------+---------+-----------+----------+-------------------+  PERO      Full                                             Unable to visualize                                                              the proximal region  +---------+---------------+---------+-----------+----------+-------------------+   Right Technical Findings: Not visualized segments include proximal  PTV and proximal Peroneal. Unable to visualize due to open wound in the mid calf at the site of the necrotic tissue.  +----+---------------+---------+-----------+----------+-------+  LEFT Compressibility Phasicity Spontaneity Properties Summary  +----+---------------+---------+-----------+----------+-------+  CFV  Full            Yes       Yes                             +----+---------------+---------+-----------+----------+-------+  SFJ  Full                                                      +----+---------------+---------+-----------+----------+-------+     Summary: Right: There is no evidence of deep vein thrombosis in the lower extremity. However, portions of this examination were limited- see technical findings listed above. Left: There is no evidence of a common femoral vein obstruction.  *See table(s) above for measurements and observations.    Preliminary     EKG: Not performed  Assessment/Plan Active Problems:   Cellulitis of right lower extremity   Myofasciitis   Elevated liver enzymes  Raeqwon Lux is a Spanish-speaking  63 y.o. male with medical history significant for bilateral knee osteoarthritis who is admitted for right lower extremity cellulitis with myofasciitis after reported spider bite.   Cellulitis of right lower extremity with myofasciitis: Significant cellulitis of RLE after reported spider bite.  EDP discussed with orthopedics who did not feel surgical intervention warranted at time of admission and recommended reconsulting if patient does not respond to IV antibiotic therapy. -Continue IV vancomycin, will add IV Zosyn -Follow-up blood cultures  Elevated liver enzymes: Mildly elevated, likely due to acute illness.  Patient denies any abdominal pain.  Will continue to monitor labs.  DVT prophylaxis: SCDs Code Status: Full code, confirmed with patient Family Communication: None available on admission. Disposition Plan: Likely discharge to home pending clinical  progress Consults called: EDP discussed with orthopedics, reconsult if needed Admission status: Admit - It is my clinical opinion that admission to INPATIENT is reasonable and necessary because of the expectation that this patient will require hospital care that crosses at least 2 midnights to treat this condition based on the medical complexity of the problems presented.  Given the aforementioned information, the predictability of an adverse outcome is felt to be significant.      Zada Finders MD [facility]  If 7PM-7AM, please contact night-coverage [email or web address]  04/08/2019, 9:23 PM

## 2019-04-08 NOTE — ED Notes (Signed)
Transport called to take patient upstairs 
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
## 2019-04-08 NOTE — ED Notes (Signed)
Bed: WA03 Expected date:  Expected time:  Means of arrival:  Comments: EMS spider bite

## 2019-04-08 NOTE — ED Notes (Signed)
Interpreter Mario #[id number] 

## 2019-04-09 ENCOUNTER — Other Ambulatory Visit: Payer: Self-pay

## 2019-04-09 DIAGNOSIS — L03115 Cellulitis of right lower limb: Secondary | ICD-10-CM

## 2019-04-09 DIAGNOSIS — M609 Myositis, unspecified: Secondary | ICD-10-CM

## 2019-04-09 DIAGNOSIS — A409 Streptococcal sepsis, unspecified: Secondary | ICD-10-CM

## 2019-04-09 LAB — HIV ANTIBODY (ROUTINE TESTING W REFLEX): HIV Screen 4th Generation wRfx: NONREACTIVE

## 2019-04-09 LAB — COMPREHENSIVE METABOLIC PANEL
ALT: 57 U/L — ABNORMAL HIGH (ref 0–44)
AST: 71 U/L — ABNORMAL HIGH (ref 15–41)
Albumin: 2.5 g/dL — ABNORMAL LOW (ref 3.5–5.0)
Alkaline Phosphatase: 182 U/L — ABNORMAL HIGH (ref 38–126)
Anion gap: 9 (ref 5–15)
BUN: 23 mg/dL (ref 8–23)
CO2: 22 mmol/L (ref 22–32)
Calcium: 7.6 mg/dL — ABNORMAL LOW (ref 8.9–10.3)
Chloride: 107 mmol/L (ref 98–111)
Creatinine, Ser: 0.89 mg/dL (ref 0.61–1.24)
GFR calc Af Amer: >60 mL/min (ref >60)
GFR calc non Af Amer: >60 mL/min (ref >60)
Glucose, Bld: 96 mg/dL (ref 70–99)
Potassium: 3.6 mmol/L (ref 3.5–5.1)
Sodium: 138 mmol/L (ref 135–145)
Total Bilirubin: 0.7 mg/dL (ref 0.3–1.2)
Total Protein: 6.1 g/dL — ABNORMAL LOW (ref 6.5–8.1)

## 2019-04-09 LAB — CBC
HCT: 33.4 % — ABNORMAL LOW (ref 39.0–52.0)
Hemoglobin: 11.1 g/dL — ABNORMAL LOW (ref 13.0–17.0)
MCH: 30.9 pg (ref 26.0–34.0)
MCHC: 33.2 g/dL (ref 30.0–36.0)
MCV: 93 fL (ref 80.0–100.0)
Platelets: 336 10*3/uL (ref 150–400)
RBC: 3.59 MIL/uL — ABNORMAL LOW (ref 4.22–5.81)
RDW: 15.1 % (ref 11.5–15.5)
WBC: 31.6 10*3/uL — ABNORMAL HIGH (ref 4.0–10.5)
nRBC: 0 % (ref 0.0–0.2)

## 2019-04-09 MED ORDER — POLYETHYLENE GLYCOL 3350 17 G PO PACK: 17.0000 g | PACK | Freq: Every day | ORAL | Status: DC | PRN

## 2019-04-09 MED ORDER — SODIUM CHLORIDE 0.9 % IV BOLUS
500.0000 mL | Freq: Once | INTRAVENOUS | Status: AC
  Administered 2019-04-09: 500 mL via INTRAVENOUS

## 2019-04-09 MED ORDER — SENNOSIDES-DOCUSATE SODIUM 8.6-50 MG PO TABS: 2.0000 | ORAL_TABLET | Freq: Every evening | ORAL | Status: DC | PRN

## 2019-04-09 MED ORDER — HEPARIN SODIUM (PORCINE) 5000 UNIT/ML IJ SOLN
5000.0000 [IU] | Freq: Three times a day (TID) | INTRAMUSCULAR | Status: DC
  Administered 2019-04-09 – 2019-04-14 (×16): 5000 [IU] via SUBCUTANEOUS
  Filled 2019-04-09 (×15): qty 1

## 2019-04-09 MED ORDER — SODIUM CHLORIDE 0.9 % IV SOLN
INTRAVENOUS | Status: DC
  Administered 2019-04-09 – 2019-04-13 (×4): via INTRAVENOUS

## 2019-04-09 NOTE — Progress Notes (Signed)
PROGRESS NOTE    Evan Gray  ZOX:[id number] DOB: June 16, 1956 DOA: 04/08/2019 PCP: Patient, No Pcp Per   Brief Narrative:  63 year old with no known past medical history came to the hospital with complains of right lower extremity swelling and erythema for the past 2-3 days which has been progressing.  Patient states he had spider bite about a week ago and that area has continued to worsen.  Does not take any medications at home.  Dopplers negative for DVT but MRI showed mild fasciitis involving superficial posterior compartment with extensive cellulitis.   Assessment & Plan:   Active Problems:   Cellulitis of right lower extremity   Myofasciitis   Elevated liver enzymes  Sepsis Right lower extremity extensive cellulitis with mild fasciitis involving superior posterior compartment; POA Fascial fluid extending superiorly into popliteal fossa - MRI findings consistent.  Continue IV vancomycin and Zosyn -Wound care, per their team may need local debridement.  We consulted orthopedic- Dr Roda Shutters this morning, will take a look at it. -Continue supportive care -Lower extremity Dopplers-negative -Pain control, bowel regimen  Mild transaminitis -Resolved.  Continue monitor.  DVT prophylaxis: Hep SQ Code Status: Full  Family Communication:  None at bedside Disposition Plan: Maintain Hosp Stay for IV Abx  Consultants:   Ortho  Procedures:   None  Antimicrobials:   Vanc  D2  Zosyn D2   Subjective: Spoke with the patient this morning with Spanish interpreter [place], [place] [id number] Patient stated he felt little better since yesterday after getting IV antibiotics and pain medication.  But still has extensive cellulitis but area.  Review of Systems Otherwise negative except as per HPI, including: General: Denies fever, chills, night sweats or unintended weight loss. Resp: Denies cough, wheezing, shortness of breath. Cardiac: Denies chest pain, palpitations, orthopnea, paroxysmal  nocturnal dyspnea. GI: Denies abdominal pain, nausea, vomiting, diarrhea or constipation GU: Denies dysuria, frequency, hesitancy or incontinence MS: Per HPI Neuro: Denies headache, neurologic deficits (focal weakness, numbness, tingling), abnormal gait Psych: Denies anxiety, depression, SI/HI/AVH Skin: Denies new rashes or lesions ID: Denies sick contacts, exotic exposures, travel  Objective: Vitals:   04/09/19 0500 04/09/19 0509 04/09/19 0558 04/09/19 0613  BP:   (!) 89/53 (!) 87/59  Pulse:   77 70  Resp:   18   Temp:  99 F (37.2 C) 98.7 F (37.1 C)   TempSrc:  Oral Oral   SpO2:   96%   Weight: 64.5 kg     Height:        Intake/Output Summary (Last 24 hours) at 04/09/2019 1102 Last data filed at 04/09/2019 0900 Gross per 24 hour  Intake 2664.25 ml  Output 800 ml  Net 1864.25 ml   Filed Weights   04/08/19 1317 04/09/19 0500  Weight: 68 kg 64.5 kg    Examination:  General exam: Appears calm and comfortable  Respiratory system: Clear to auscultation. Respiratory effort normal. Cardiovascular system: S1 & S2 heard, RRR. No JVD, murmurs, rubs, gallops or clicks. No pedal edema. Gastrointestinal system: Abdomen is nondistended, soft and nontender. No organomegaly or masses felt. Normal bowel sounds heard. Central nervous system: Alert and oriented. No focal neurological deficits. Extremities: Symmetric 5 x 5 power. Skin: Extensive cellulitis of the right lower extremity with darkened areas.  Very tender to palpation.  Quite erythematous. Psychiatry: Judgement and insight appear normal. Mood & affect appropriate.     Data Reviewed:   CBC: Recent Labs  Lab 04/08/19 1437 04/09/19 0329  WBC 37.2* 31.6*  NEUTROABS  34.6*  --   HGB 13.2 11.1*  HCT 41.2 33.4*  MCV 93.2 93.0  PLT 348 336   Basic Metabolic Panel: Recent Labs  Lab 04/08/19 1437 04/09/19 0329  NA 138 138  K 3.9 3.6  CL 103 107  CO2 26 22  GLUCOSE 92 96  BUN 37* 23  CREATININE 1.01 0.89    CALCIUM 8.5* 7.6*   GFR: Estimated Creatinine Clearance: 75.7 mL/min (by C-G formula based on SCr of 0.89 mg/dL). Liver Function Tests: Recent Labs  Lab 04/08/19 1437 04/09/19 0329  AST 66* 71*  ALT 61* 57*  ALKPHOS 154* 182*  BILITOT 0.6 0.7  PROT 7.7 6.1*  ALBUMIN 3.3* 2.5*   No results for input(s): LIPASE, AMYLASE in the last 168 hours. No results for input(s): AMMONIA in the last 168 hours. Coagulation Profile: No results for input(s): INR, PROTIME in the last 168 hours. Cardiac Enzymes: No results for input(s): CKTOTAL, CKMB, CKMBINDEX, TROPONINI in the last 168 hours. BNP (last 3 results) No results for input(s): PROBNP in the last 8760 hours. HbA1C: No results for input(s): HGBA1C in the last 72 hours. CBG: No results for input(s): GLUCAP in the last 168 hours. Lipid Profile: No results for input(s): CHOL, HDL, LDLCALC, TRIG, CHOLHDL, LDLDIRECT in the last 72 hours. Thyroid Function Tests: No results for input(s): TSH, T4TOTAL, FREET4, T3FREE, THYROIDAB in the last 72 hours. Anemia Panel: No results for input(s): VITAMINB12, FOLATE, FERRITIN, TIBC, IRON, RETICCTPCT in the last 72 hours. Sepsis Labs: Recent Labs  Lab 04/08/19 1437  LATICACIDVEN 1.5    Recent Results (from the past 240 hour(s))  Blood culture (routine x 2)     Status: None (Preliminary result)   Collection Time: 04/08/19  1:07 PM   Specimen: BLOOD  Result Value Ref Range Status   Specimen Description   Final    BLOOD RIGHT ARM Performed at [facility], [address] [place], [place] [address]    Special Requests   Final    BOTTLES DRAWN AEROBIC AND ANAEROBIC Blood Culture adequate volume Performed at [facility], [address] [place], [place] [address]    Culture   Final    NO GROWTH < 24 HOURS Performed at [facility], [address] [place], [place] [address]    Report Status PENDING  Incomplete  Blood culture (routine x 2)      Status: None (Preliminary result)   Collection Time: 04/08/19  2:39 PM   Specimen: BLOOD RIGHT FOREARM  Result Value Ref Range Status   Specimen Description   Final    BLOOD RIGHT FOREARM Performed at [facility], [address] [place], [place] [address]    Special Requests   Final    BOTTLES DRAWN AEROBIC AND ANAEROBIC Blood Culture adequate volume Performed at [facility], [address] [place], [place] [address]    Culture   Final    NO GROWTH < 24 HOURS Performed at [facility], [address] [place], [place] [address]    Report Status PENDING  Incomplete  SARS Coronavirus 2 (CEPHEID - Performed in [facility] hospital lab), Hosp Order     Status: None   Collection Time: 04/08/19  6:04 PM   Specimen: Nasopharyngeal Swab  Result Value Ref Range Status   SARS Coronavirus 2 NEGATIVE NEGATIVE Final    Comment: (NOTE) If result is NEGATIVE SARS-CoV-2 target nucleic acids are NOT DETECTED. The SARS-CoV-2  RNA is generally detectable in upper and lower  respiratory specimens during the acute phase of infection. The lowest  concentration of SARS-CoV-2 viral copies this assay can detect is 250  copies / mL. A negative result does not preclude SARS-CoV-2 infection  and should not be used as the sole basis for treatment or other  patient management decisions.  A negative result may occur with  improper specimen collection / handling, submission of specimen other  than nasopharyngeal swab, presence of viral mutation(s) within the  areas targeted by this assay, and inadequate number of viral copies  (<250 copies / mL). A negative result must be combined with clinical  observations, patient history, and epidemiological information. If result is POSITIVE SARS-CoV-2 target nucleic acids are DETECTED. The SARS-CoV-2 RNA is generally detectable in upper and lower  respiratory specimens dur ing the acute phase of infection.  Positive  results are  indicative of active infection with SARS-CoV-2.  Clinical  correlation with patient history and other diagnostic information is  necessary to determine patient infection status.  Positive results do  not rule out bacterial infection or co-infection with other viruses. If result is PRESUMPTIVE POSTIVE SARS-CoV-2 nucleic acids MAY BE PRESENT.   A presumptive positive result was obtained on the submitted specimen  and confirmed on repeat testing.  While 2019 novel coronavirus  (SARS-CoV-2) nucleic acids may be present in the submitted sample  additional confirmatory testing may be necessary for epidemiological  and / or clinical management purposes  to differentiate between  SARS-CoV-2 and other Sarbecovirus currently known to infect humans.  If clinically indicated additional testing with an alternate test  methodology [phone]([id number]) is advised. The SARS-CoV-2 RNA is generally  detectable in upper and lower respiratory sp ecimens during the acute  phase of infection. The expected result is Negative. Fact Sheet for Patients:  BoilerBrush.com.cyhttps://www.fda.gov/media/136312/download Fact Sheet for Healthcare Providers: https://pope.com/https://www.fda.gov/media/136313/download This test is not yet approved or cleared by the [place] FDA and has been authorized for detection and/or diagnosis of SARS-CoV-2 by FDA under an Emergency Use Authorization (EUA).  This EUA will remain in effect (meaning this test can be used) for the duration of the COVID-19 declaration under Section 564(b)(1) of the Act, 21 U.S.C. section 360bbb-3(b)(1), unless the authorization is terminated or revoked sooner. Performed at [facility], [address] [place], [place] [address]          Radiology Studies: Dg Tibia/fibula Right  Result Date: 04/08/2019 CLINICAL DATA:  Pain and swelling. EXAM: RIGHT TIBIA AND FIBULA - 2 VIEW COMPARISON:  None. FINDINGS: Advanced degenerative changes at the knee joint are noted.  Evidence of remote fractures of the tibia and fibula with prior hardware removal. No acute bony findings, destructive bony changes or radiopaque foreign body. IMPRESSION: Degenerative changes and evidence of remote trauma and surgery. No acute findings. Electronically Signed   By: Rudie MeyerP.  Gallerani M.D.   On: 04/08/2019 13:56   Mr Tibia Fibula Right W Wo Contrast  Result Date: 04/08/2019 CLINICAL DATA:  Lower leg redness and swelling after spider bite. EXAM: MRI OF LOWER RIGHT EXTREMITY WITHOUT AND WITH CONTRAST TECHNIQUE: Multiplanar, multisequence MR imaging of the right tibia and fibula was performed both before and after administration of intravenous contrast. CONTRAST:  6 mL Gadavist intravenous contrast. COMPARISON:  Right tibia and fibula x-rays from same day. FINDINGS: Bones/Joint/Cartilage No suspicious marrow signal abnormality. Advanced bilateral knee osteoarthritis, most severe in the right lateral compartment, with prominent subchondral marrow edema. Multiple intra-articular  bodies in the posterior right knee joint space. No acute fracture or dislocation. Old healed fractures of the right proximal tibial metaphysis and proximal fibular diaphysis. Normal alignment. Small left and trace right knee joint effusions. Muscles and Tendons Edema and enhancement primarily involving the medial gastrocnemius muscle, and to lesser extent the lateral gastrocnemius muscle and anterior aspect of the soleus muscle. Prominent inter fascial fluid between the medial gastrocnemius and soleus muscles extending superiorly into the popliteal fossa. Soft tissue Extensive diffuse soft tissue swelling and enhancement of the lower leg. There are patchy areas of non-enhancement along the medial distal lower leg (series 10, image 41), consistent with devitalized tissue. No discrete rim enhancing fluid collection. No soft tissue mass. IMPRESSION: 1. Extensive cellulitis of the right lower leg with underlying myofasciitis involving  the superficial posterior compartment. Fascial fluid extends superiorly into the popliteal fossa. Areas of devitalized superficial soft tissue along the medial lower leg without discrete abscess. 2. No evidence of osteomyelitis. 3. Advanced bilateral knee osteoarthritis. Electronically Signed   By: Titus Dubin M.D.   On: 04/08/2019 17:44   Vas [place] Lower Extremity Venous (dvt) (only Mc & Wl)  Result Date: 04/08/2019  Lower Venous Study Indications: Right sided Swelling, Erythema, and Post spider bite 5 days ago. Necrotic tissue in the calf area.  Limitations: Open wound. Comparison Study: No previous exam available Performing Technologist: Toma Copier RVS  Examination Guidelines: A complete evaluation includes B-mode imaging, spectral Doppler, color Doppler, and power Doppler as needed of all accessible portions of each vessel. Bilateral testing is considered an integral part of a complete examination. Limited examinations for reoccurring indications may be performed as noted.  +---------+---------------+---------+-----------+----------+-------------------+  RIGHT     Compressibility Phasicity Spontaneity Properties Summary              +---------+---------------+---------+-----------+----------+-------------------+  CFV       Full            Yes       Yes                                         +---------+---------------+---------+-----------+----------+-------------------+  SFJ       Full                                                                  +---------+---------------+---------+-----------+----------+-------------------+  FV Prox   Full            Yes       Yes                                         +---------+---------------+---------+-----------+----------+-------------------+  FV Mid    Full                                                                  +---------+---------------+---------+-----------+----------+-------------------+  FV Distal Full  Yes       Yes                                          +---------+---------------+---------+-----------+----------+-------------------+  PFV       Full            Yes       Yes                                         +---------+---------------+---------+-----------+----------+-------------------+  POP       Full            Yes       Yes                                         +---------+---------------+---------+-----------+----------+-------------------+  PTV       Full                                                                  +---------+---------------+---------+-----------+----------+-------------------+  PERO      Full                                             Unable to visualize                                                              the proximal region  +---------+---------------+---------+-----------+----------+-------------------+   Right Technical Findings: Not visualized segments include proximal PTV and proximal Peroneal. Unable to visualize due to open wound in the mid calf at the site of the necrotic tissue.  +----+---------------+---------+-----------+----------+-------+  LEFT Compressibility Phasicity Spontaneity Properties Summary  +----+---------------+---------+-----------+----------+-------+  CFV  Full            Yes       Yes                             +----+---------------+---------+-----------+----------+-------+  SFJ  Full                                                      +----+---------------+---------+-----------+----------+-------+     Summary: Right: There is no evidence of deep vein thrombosis in the lower extremity. However, portions of this examination were limited- see technical findings listed above. Left: There is no evidence of a common femoral vein obstruction.  *See table(s) above for measurements and observations. Electronically signed by Lemar Livings MD on 04/08/2019 at 9:36:41 PM.  Final         Scheduled Meds: Continuous Infusions:  sodium chloride      piperacillin-tazobactam (ZOSYN)  IV 3.375 g (04/09/19 0331)   vancomycin 750 mg (04/09/19 0520)     LOS: 1 day   Time spent= 35 mins    Jaria Conway Joline Maxcyhirag Johnedward Brodrick, MD [facility]  If 7PM-7AM, please contact night-coverage [email or web address] 04/09/2019, 11:02 AM

## 2019-04-09 NOTE — Consult Note (Signed)
ORTHOPAEDIC CONSULTATION  REQUESTING PHYSICIAN: Dimple NanasAmin, Ankit Chirag, MD  Chief Complaint: RLE cellulitis, bug bite  HPI: Evan Gray is a 63 y.o. male who [facility] with RLE swelling, redness, cellulits, pain of the right calf.  hispanic male who does not speak english.  HPI details are limited today.  MRI of RLE showed myofascitis without any abscesses.  Ortho consulted per recommendation by wound nurse for evaluation for surgical debridement.  Past Medical History:  Diagnosis Date   Osteoarthritis of knees, bilateral    Past Surgical History:  Procedure Laterality Date   KNEE SURGERY     Social History   Socioeconomic History   Marital status: Married    Spouse name: Not on file   Number of children: Not on file   Years of education: Not on file   Highest education level: Not on file  Occupational History   Not on file  Social Needs   Financial resource strain: Not on file   Food insecurity    Worry: Not on file    Inability: Not on file   Transportation needs    Medical: Not on file    Non-medical: Not on file  Tobacco Use   Smoking status: Former Smoker   Smokeless tobacco: Never Used  Substance and Sexual Activity   Alcohol use: Not Currently   Drug use: Never   Sexual activity: Not on file  Lifestyle   Physical activity    Days per week: Not on file    Minutes per session: Not on file   Stress: Not on file  Relationships   Social connections    Talks on phone: Not on file    Gets together: Not on file    Attends religious service: Not on file    Active member of club or organization: Not on file    Attends meetings of clubs or organizations: Not on file    Relationship status: Not on file  Other Topics Concern   Not on file  Social History Narrative   Not on file   Family History  Family history unknown: Yes   - negative except otherwise stated in the family history section No Known Allergies Prior to  Admission medications   Medication Sig Start Date End Date Taking? Authorizing Provider  OVER THE COUNTER MEDICATION Take 2 tablets by mouth 2 (two) times a day. Tablets sold at the [employer] that contain Diclofenac and B vitamins.   Yes [provider]  Pediatric Multivit-Minerals-C (RA GUMMY VITAMINS & MINERALS PO) Take 2 tablets by mouth 2 (two) times a day.   Yes [provider]   Dg Tibia/fibula Right  Result Date: 04/08/2019 CLINICAL DATA:  Pain and swelling. EXAM: RIGHT TIBIA AND FIBULA - 2 VIEW COMPARISON:  None. FINDINGS: Advanced degenerative changes at the knee joint are noted. Evidence of remote fractures of the tibia and fibula with prior hardware removal. No acute bony findings, destructive bony changes or radiopaque foreign body. IMPRESSION: Degenerative changes and evidence of remote trauma and surgery. No acute findings. Electronically Signed   By: Rudie MeyerP.  Gallerani M.D.   On: 04/08/2019 13:56   Mr Tibia Fibula Right W Wo Contrast  Result Date: 04/08/2019 CLINICAL DATA:  Lower leg redness and swelling after spider bite. EXAM: MRI OF LOWER RIGHT EXTREMITY WITHOUT AND WITH CONTRAST TECHNIQUE: Multiplanar, multisequence MR imaging of the right tibia and fibula was performed both before and after administration of intravenous contrast. CONTRAST:  6 mL Gadavist intravenous contrast. COMPARISON:  Right tibia and fibula x-rays from same day. FINDINGS: Bones/Joint/Cartilage No suspicious marrow signal abnormality. Advanced bilateral knee osteoarthritis, most severe in the right lateral compartment, with prominent subchondral marrow edema. Multiple intra-articular bodies in the posterior right knee joint space. No acute fracture or dislocation. Old healed fractures of the right proximal tibial metaphysis and proximal fibular diaphysis. Normal alignment. Small left and trace right knee joint effusions. Muscles and Tendons Edema and enhancement primarily involving the medial  gastrocnemius muscle, and to lesser extent the lateral gastrocnemius muscle and anterior aspect of the soleus muscle. Prominent inter fascial fluid between the medial gastrocnemius and soleus muscles extending superiorly into the popliteal fossa. Soft tissue Extensive diffuse soft tissue swelling and enhancement of the lower leg. There are patchy areas of non-enhancement along the medial distal lower leg (series 10, image 41), consistent with devitalized tissue. No discrete rim enhancing fluid collection. No soft tissue mass. IMPRESSION: 1. Extensive cellulitis of the right lower leg with underlying myofasciitis involving the superficial posterior compartment. Fascial fluid extends superiorly into the popliteal fossa. Areas of devitalized superficial soft tissue along the medial lower leg without discrete abscess. 2. No evidence of osteomyelitis. 3. Advanced bilateral knee osteoarthritis. Electronically Signed   By: Obie DredgeWilliam T Derry M.D.   On: 04/08/2019 17:44   Vas [place] Lower Extremity Venous (dvt) (only [facility])  Result Date: 04/08/2019  Lower Venous Study Indications: Right sided Swelling, Erythema, and Post spider bite 5 days ago. Necrotic tissue in the calf area.  Limitations: Open wound. Comparison Study: No previous exam available Performing Technologist: Toma DeitersVirginia Slaughter RVS  Examination Guidelines: A complete evaluation includes B-mode imaging, spectral Doppler, color Doppler, and power Doppler as needed of all accessible portions of each vessel. Bilateral testing is considered an integral part of a complete examination. Limited examinations for reoccurring indications may be performed as noted.  +---------+---------------+---------+-----------+----------+-------------------+  RIGHT     Compressibility Phasicity Spontaneity Properties Summary              +---------+---------------+---------+-----------+----------+-------------------+  CFV       Full            Yes       Yes                                          +---------+---------------+---------+-----------+----------+-------------------+  SFJ       Full                                                                  +---------+---------------+---------+-----------+----------+-------------------+  FV Prox   Full            Yes       Yes                                         +---------+---------------+---------+-----------+----------+-------------------+  FV Mid    Full                                                                  +---------+---------------+---------+-----------+----------+-------------------+  FV Distal Full            Yes       Yes                                         +---------+---------------+---------+-----------+----------+-------------------+  PFV       Full            Yes       Yes                                         +---------+---------------+---------+-----------+----------+-------------------+  POP       Full            Yes       Yes                                         +---------+---------------+---------+-----------+----------+-------------------+  PTV       Full                                                                  +---------+---------------+---------+-----------+----------+-------------------+  PERO      Full                                             Unable to visualize                                                              the proximal region  +---------+---------------+---------+-----------+----------+-------------------+   Right Technical Findings: Not visualized segments include proximal PTV and proximal Peroneal. Unable to visualize due to open wound in the mid calf at the site of the necrotic tissue.  +----+---------------+---------+-----------+----------+-------+  LEFT Compressibility Phasicity Spontaneity Properties Summary  +----+---------------+---------+-----------+----------+-------+  CFV  Full            Yes       Yes                              +----+---------------+---------+-----------+----------+-------+  SFJ  Full                                                      +----+---------------+---------+-----------+----------+-------+     Summary: Right: There is no evidence of deep vein thrombosis in the lower extremity. However, portions of this examination were limited- see technical findings listed above. Left: There is no evidence of a common femoral vein obstruction.  *See table(s) above for measurements and observations.  Electronically signed by Lemar LivingsBrandon Cain MD on 04/08/2019 at 9:36:41 PM.    Final    - pertinent xrays, CT, MRI studies were reviewed and independently interpreted  Positive ROS: All other systems have been reviewed and were otherwise negative with the exception of those mentioned in the HPI and as above.  Physical Exam: General: Alert, no acute distress Cardiovascular: No pedal edema Respiratory: No cyanosis, no use of accessory musculature GI: No organomegaly, abdomen is soft and non-tender Skin: No lesions in the area of chief complaint Neurologic: Sensation intact distally Psychiatric: Patient is competent for consent with normal mood and affect Lymphatic: No axillary or cervical lymphadenopathy  MUSCULOSKELETAL:      epidermiolysis present Compartments are very soft NVI distally Minimally ttp Mild swelling  Assessment: RLE myofascitis  Plan: - MRI reviewed - no abscesses to surgically drain - currently, soft tissues are too fragile to debride as there is high risk of large soft tissue defect.  recommend continued xeroform dressings to wounds and allow to demarcate, may ultimately need I&D - continue IV abx - will follow exam  Thank you for the consult and the opportunity to see Evan Gray  N. Glee ArvinMichael Payten Hobin, MD [phone] [id number] 7:09 PM

## 2019-04-09 NOTE — Consult Note (Signed)
[place] Nurse wound consult note Reason for Consult: Lower leg wound  Wound type:Spider envenomation   Pressure Injury POA: NA Measurement: did not measure, reviewed images  Wound bed: bullous area that extends the length of the medial pretibial region and darkened circular area on the patella. Distal aspect of the bullous wound does have some thicker gelatinous appearing material with surrounding skin necrosis Drainage (amount, consistency, odor) moderate, serous per nursing flow sheet Periwound: erythema and edema Dressing procedure/placement/frequency: Recommended topical debridement of the distal portion of the wound, most often this presentation will turn to harder eschar. Surgical team recommended orthopedic evaluation which is pending. I have added topical care for current wound presentation.  Discussed with bedside nurse to add topical care after orthopedic eval.   Discussed POC with bedside nurse.  Re consult if needed, will not follow at this time. Thanks  Collier Monica [employer], RN,CWOCN, CNS, [place] [phone])

## 2019-04-10 LAB — CREATININE, SERUM
Creatinine, Ser: 0.81 mg/dL (ref 0.61–1.24)
GFR calc Af Amer: >60 mL/min (ref >60)
GFR calc non Af Amer: >60 mL/min (ref >60)

## 2019-04-10 MED ORDER — SENNOSIDES-DOCUSATE SODIUM 8.6-50 MG PO TABS: 2.0000 | ORAL_TABLET | Freq: Every evening | ORAL | Status: DC | PRN

## 2019-04-10 MED ORDER — PHENOL 1.4 % MT LIQD
1.0000 | OROMUCOSAL | Status: DC | PRN
  Filled 2019-04-10: qty 177

## 2019-04-10 MED ORDER — GUAIFENESIN-DM 100-10 MG/5ML PO SYRP: 5.0000 mL | ORAL_SOLUTION | ORAL | Status: DC | PRN

## 2019-04-10 MED ORDER — HYDRALAZINE HCL 20 MG/ML IJ SOLN: 10.0000 mg | INTRAMUSCULAR | Status: DC | PRN

## 2019-04-10 MED ORDER — HYDROCORTISONE 1 % EX CREA
1.0000 "application " | TOPICAL_CREAM | Freq: Three times a day (TID) | CUTANEOUS | Status: DC | PRN
  Filled 2019-04-10: qty 28

## 2019-04-10 MED ORDER — SALINE SPRAY 0.65 % NA SOLN
1.0000 | NASAL | Status: DC | PRN
  Filled 2019-04-10: qty 44

## 2019-04-10 MED ORDER — ALUM & MAG HYDROXIDE-SIMETH 200-200-20 MG/5ML PO SUSP
30.0000 mL | ORAL | Status: DC | PRN
  Administered 2019-04-11 – 2019-04-14 (×3): 30 mL via ORAL
  Filled 2019-04-10 (×3): qty 30

## 2019-04-10 MED ORDER — MUSCLE RUB 10-15 % EX CREA
1.0000 "application " | TOPICAL_CREAM | CUTANEOUS | Status: DC | PRN
  Filled 2019-04-10: qty 85

## 2019-04-10 MED ORDER — HYDROCORTISONE (PERIANAL) 2.5 % EX CREA
1.0000 "application " | TOPICAL_CREAM | Freq: Four times a day (QID) | CUTANEOUS | Status: DC | PRN
  Filled 2019-04-10: qty 28.35

## 2019-04-10 MED ORDER — LIP MEDEX EX OINT: 1.0000 "application " | TOPICAL_OINTMENT | CUTANEOUS | Status: DC | PRN

## 2019-04-10 MED ORDER — POLYVINYL ALCOHOL 1.4 % OP SOLN
1.0000 [drp] | OPHTHALMIC | Status: DC | PRN
  Filled 2019-04-10: qty 15

## 2019-04-10 MED ORDER — LORATADINE 10 MG PO TABS: 10.0000 mg | ORAL_TABLET | Freq: Every day | ORAL | Status: DC | PRN

## 2019-04-10 NOTE — Progress Notes (Signed)
PROGRESS NOTE    Evan Gray  WUJ:[id number] DOB: 11-13-1955 DOA: 04/08/2019 PCP: Patient, No Pcp Per   Brief Narrative:  63 year old with no known past medical history came to the hospital with complains of right lower extremity swelling and erythema for the past 2-3 days which has been progressing.  Patient states he had spider bite about a week ago and that area has continued to worsen.  Does not take any medications at home.  Dopplers negative for DVT but MRI showed mild fasciitis involving superficial posterior compartment with extensive cellulitis.   Assessment & Plan:   Active Problems:   Cellulitis of right lower extremity   Myofasciitis   Elevated liver enzymes  Sepsis Right lower extremity extensive cellulitis with mild fasciitis involving superior posterior compartment; POA Fascial fluid extending superiorly into popliteal fossa - MRI findings consistent.  IV vancomycin/Zosyn day 3 -Wound care, per their team may need local debridement.  Appreciate input from orthopedic team -Continue supportive care -Lower extremity Dopplers-negative -Pain control, bowel regimen  Mild transaminitis -Resolved.  Continue monitor.  DVT prophylaxis: Hep SQ Code Status: Full  Family Communication:  None at bedside Disposition Plan: Maintain Hosp Stay for IV Abx  Consultants:   Ortho  Procedures:   None  Antimicrobials:   Vanc  D3  Zosyn D3   Subjective: Spoke with the patient this morning with Spanish interpreter Pricella [phone] Feels better, feels like the swelling has gone down but no complaints otherwise.  He does have some pain with flexion of his knee.  Review of Systems Otherwise negative except as per HPI, including: General = no fevers, chills, dizziness, malaise, fatigue HEENT/EYES = negative for pain, redness, loss of vision, double vision, blurred vision, loss of hearing, sore throat, hoarseness, dysphagia Cardiovascular= negative for chest pain,  palpitation, murmurs, lower extremity swelling Respiratory/lungs= negative for shortness of breath, cough, hemoptysis, wheezing, mucus production Gastrointestinal= negative for nausea, vomiting,, abdominal pain, melena, hematemesis Genitourinary= negative for Dysuria, Hematuria, Change in Urinary Frequency MSK = pain with flexion of his knee Neurology= Negative for headache, seizures, numbness, tingling  Psychiatry= Negative for anxiety, depression, suicidal and homocidal ideation Allergy/Immunology= Medication/Food allergy as listed  Skin= Negative for Rash, lesions, ulcers, itching   Objective: Vitals:   04/09/19 1250 04/09/19 1340 04/09/19 2051 04/10/19 0427  BP: 122/76 121/73 120/72 106/64  Pulse: 74 78 79 77  Resp:  Temp:  98 F (36.7 C) 99.7 F (37.6 C) (!) 101.3 F (38.5 C)  TempSrc:   Oral Oral  SpO2: 98% 100% 97% 98%  Weight:      Height:        Intake/Output Summary (Last 24 hours) at 04/10/2019 1112 Last data filed at 04/10/2019 1044 Gross per 24 hour  Intake 2520.08 ml  Output 2255 ml  Net 265.08 ml   Filed Weights   04/08/19 1317 04/09/19 0500  Weight: 68 kg 64.5 kg    Examination:  Constitutional: NAD, calm, comfortable Eyes: PERRL, lids and conjunctivae normal ENMT: Mucous membranes are moist. Posterior pharynx clear of any exudate or lesions.Normal dentition.  Neck: normal, supple, no masses, no thyromegaly Respiratory: clear to auscultation bilaterally, no wheezing, no crackles. Normal respiratory effort. No accessory muscle use.  Cardiovascular: Regular rate and rhythm, no murmurs / rubs / gallops. No extremity edema. 2+ pedal pulses. No carotid bruits.  Abdomen: no tenderness, no masses palpated. No hepatosplenomegaly. Bowel sounds positive.  Musculoskeletal: no clubbing / cyanosis. No joint deformity upper and lower extremities.  Good ROM, no contractures. Normal muscle tone.  Skin: Extensive cellulitis of the right lower extremity.   Currently dressing in place. Neurologic: CN 2-12 grossly intact. Sensation intact, DTR normal. Strength 5/5 in all 4.  Psychiatric: Normal judgment and insight. Alert and oriented x 3. Normal mood.     Data Reviewed:   CBC: Recent Labs  Lab 04/08/19 1437 04/09/19 0329  WBC 37.2* 31.6*  NEUTROABS 34.6*  --   HGB 13.2 11.1*  HCT 41.2 33.4*  MCV 93.2 93.0  PLT 348 336   Basic Metabolic Panel: Recent Labs  Lab 04/08/19 1437 04/09/19 0329 04/10/19 0237  NA 138 138  --   K 3.9 3.6  --   CL 103 107  --   CO2 26 22  --   GLUCOSE 92 96  --   BUN 37* 23  --   CREATININE 1.01 0.89 0.81  CALCIUM 8.5* 7.6*  --    GFR: Estimated Creatinine Clearance: 83.1 mL/min (by C-G formula based on SCr of 0.81 mg/dL). Liver Function Tests: Recent Labs  Lab 04/08/19 1437 04/09/19 0329  AST 66* 71*  ALT 61* 57*  ALKPHOS 154* 182*  BILITOT 0.6 0.7  PROT 7.7 6.1*  ALBUMIN 3.3* 2.5*   No results for input(s): LIPASE, AMYLASE in the last 168 hours. No results for input(s): AMMONIA in the last 168 hours. Coagulation Profile: No results for input(s): INR, PROTIME in the last 168 hours. Cardiac Enzymes: No results for input(s): CKTOTAL, CKMB, CKMBINDEX, TROPONINI in the last 168 hours. BNP (last 3 results) No results for input(s): PROBNP in the last 8760 hours. HbA1C: No results for input(s): HGBA1C in the last 72 hours. CBG: No results for input(s): GLUCAP in the last 168 hours. Lipid Profile: No results for input(s): CHOL, HDL, LDLCALC, TRIG, CHOLHDL, LDLDIRECT in the last 72 hours. Thyroid Function Tests: No results for input(s): TSH, T4TOTAL, FREET4, T3FREE, THYROIDAB in the last 72 hours. Anemia Panel: No results for input(s): VITAMINB12, FOLATE, FERRITIN, TIBC, IRON, RETICCTPCT in the last 72 hours. Sepsis Labs: Recent Labs  Lab 04/08/19 1437  LATICACIDVEN 1.5    Recent Results (from the past 240 hour(s))  Blood culture (routine x 2)     Status: None (Preliminary result)    Collection Time: 04/08/19  1:07 PM   Specimen: BLOOD  Result Value Ref Range Status   Specimen Description   Final    BLOOD RIGHT ARM Performed at [facility], [address] [place], [place] [address]    Special Requests   Final    BOTTLES DRAWN AEROBIC AND ANAEROBIC Blood Culture adequate volume Performed at [facility], [address] [place], [place] [address]    Culture   Final    NO GROWTH 2 DAYS Performed at [facility], [address] [place], [place] [address]    Report Status PENDING  Incomplete  Blood culture (routine x 2)     Status: None (Preliminary result)   Collection Time: 04/08/19  2:39 PM   Specimen: BLOOD RIGHT FOREARM  Result Value Ref Range Status   Specimen Description   Final    BLOOD RIGHT FOREARM Performed at [facility], [address] [place], [place] [address]    Special Requests   Final    BOTTLES DRAWN AEROBIC AND ANAEROBIC Blood Culture adequate volume Performed at [facility], [address] [place], [place] [address]    Culture   Final  NO GROWTH 2 DAYS Performed at [facility], [address], [place], [place] [address]    Report Status PENDING  Incomplete  SARS Coronavirus 2 (CEPHEID - Performed in [facility] hospital lab), Hosp Order     Status: None   Collection Time: 04/08/19  6:04 PM   Specimen: Nasopharyngeal Swab  Result Value Ref Range Status   SARS Coronavirus 2 NEGATIVE NEGATIVE Final    Comment: (NOTE) If result is NEGATIVE SARS-CoV-2 target nucleic acids are NOT DETECTED. The SARS-CoV-2 RNA is generally detectable in upper and lower  respiratory specimens during the acute phase of infection. The lowest  concentration of SARS-CoV-2 viral copies this assay can detect is 250  copies / mL. A negative result does not preclude SARS-CoV-2 infection  and should not be used as the sole basis for treatment or other  patient management  decisions.  A negative result may occur with  improper specimen collection / handling, submission of specimen other  than nasopharyngeal swab, presence of viral mutation(s) within the  areas targeted by this assay, and inadequate number of viral copies  (<250 copies / mL). A negative result must be combined with clinical  observations, patient history, and epidemiological information. If result is POSITIVE SARS-CoV-2 target nucleic acids are DETECTED. The SARS-CoV-2 RNA is generally detectable in upper and lower  respiratory specimens dur ing the acute phase of infection.  Positive  results are indicative of active infection with SARS-CoV-2.  Clinical  correlation with patient history and other diagnostic information is  necessary to determine patient infection status.  Positive results do  not rule out bacterial infection or co-infection with other viruses. If result is PRESUMPTIVE POSTIVE SARS-CoV-2 nucleic acids MAY BE PRESENT.   A presumptive positive result was obtained on the submitted specimen  and confirmed on repeat testing.  While 2019 novel coronavirus  (SARS-CoV-2) nucleic acids may be present in the submitted sample  additional confirmatory testing may be necessary for epidemiological  and / or clinical management purposes  to differentiate between  SARS-CoV-2 and other Sarbecovirus currently known to infect humans.  If clinically indicated additional testing with an alternate test  methodology [phone]) is advised. The SARS-CoV-2 RNA is generally  detectable in upper and lower respiratory sp ecimens during the acute  phase of infection. The expected result is Negative. Fact Sheet for Patients:  BoilerBrush.com.cy Fact Sheet for Healthcare Providers: https://pope.com/ This test is not yet approved or cleared by the [place] FDA and has been authorized for detection and/or diagnosis of SARS-CoV-2 by FDA under an  Emergency Use Authorization (EUA).  This EUA will remain in effect (meaning this test can be used) for the duration of the COVID-19 declaration under Section 564(b)(1) of the Act, 21 U.S.C. section 360bbb-3(b)(1), unless the authorization is terminated or revoked sooner. Performed at [facility], [address], [place], [place] [address]          Radiology Studies: Dg Tibia/fibula Right  Result Date: 04/08/2019 CLINICAL DATA:  Pain and swelling. EXAM: RIGHT TIBIA AND FIBULA - 2 VIEW COMPARISON:  None. FINDINGS: Advanced degenerative changes at the knee joint are noted. Evidence of remote fractures of the tibia and fibula with prior hardware removal. No acute bony findings, destructive bony changes or radiopaque foreign body. IMPRESSION: Degenerative changes and evidence of remote trauma and surgery. No acute findings. Electronically Signed   By: Rudie Meyer M.D.   On: 04/08/2019 13:56   Mr Tibia Fibula Right W  Wo Contrast  Result Date: 04/08/2019 CLINICAL DATA:  Lower leg redness and swelling after spider bite. EXAM: MRI OF LOWER RIGHT EXTREMITY WITHOUT AND WITH CONTRAST TECHNIQUE: Multiplanar, multisequence MR imaging of the right tibia and fibula was performed both before and after administration of intravenous contrast. CONTRAST:  6 mL Gadavist intravenous contrast. COMPARISON:  Right tibia and fibula x-rays from same day. FINDINGS: Bones/Joint/Cartilage No suspicious marrow signal abnormality. Advanced bilateral knee osteoarthritis, most severe in the right lateral compartment, with prominent subchondral marrow edema. Multiple intra-articular bodies in the posterior right knee joint space. No acute fracture or dislocation. Old healed fractures of the right proximal tibial metaphysis and proximal fibular diaphysis. Normal alignment. Small left and trace right knee joint effusions. Muscles and Tendons Edema and enhancement primarily involving the medial gastrocnemius  muscle, and to lesser extent the lateral gastrocnemius muscle and anterior aspect of the soleus muscle. Prominent inter fascial fluid between the medial gastrocnemius and soleus muscles extending superiorly into the popliteal fossa. Soft tissue Extensive diffuse soft tissue swelling and enhancement of the lower leg. There are patchy areas of non-enhancement along the medial distal lower leg (series 10, image 41), consistent with devitalized tissue. No discrete rim enhancing fluid collection. No soft tissue mass. IMPRESSION: 1. Extensive cellulitis of the right lower leg with underlying myofasciitis involving the superficial posterior compartment. Fascial fluid extends superiorly into the popliteal fossa. Areas of devitalized superficial soft tissue along the medial lower leg without discrete abscess. 2. No evidence of osteomyelitis. 3. Advanced bilateral knee osteoarthritis. Electronically Signed   By: Obie DredgeWilliam T Derry M.D.   On: 04/08/2019 17:44   Vas [place]as Lower Extremity Venous (dvt) (only Mc & Wl)  Result Date: 04/08/2019  Lower Venous Study Indications: Right sided Swelling, Erythema, and Post spider bite 5 days ago. Necrotic tissue in the calf area.  Limitations: Open wound. Comparison Study: No previous exam available Performing Technologist: Toma DeitersVirginia Slaughter RVS  Examination Guidelines: A complete evaluation includes B-mode imaging, spectral Doppler, color Doppler, and power Doppler as needed of all accessible portions of each vessel. Bilateral testing is considered an integral part of a complete examination. Limited examinations for reoccurring indications may be performed as noted.  +---------+---------------+---------+-----------+----------+-------------------+  RIGHT     Compressibility Phasicity Spontaneity Properties Summary              +---------+---------------+---------+-----------+----------+-------------------+  CFV       Full            Yes       Yes                                          +---------+---------------+---------+-----------+----------+-------------------+  SFJ       Full                                                                  +---------+---------------+---------+-----------+----------+-------------------+  FV Prox   Full            Yes       Yes                                         +---------+---------------+---------+-----------+----------+-------------------+  FV Mid    Full                                                                  +---------+---------------+---------+-----------+----------+-------------------+  FV Distal Full            Yes       Yes                                         +---------+---------------+---------+-----------+----------+-------------------+  PFV       Full            Yes       Yes                                         +---------+---------------+---------+-----------+----------+-------------------+  POP       Full            Yes       Yes                                         +---------+---------------+---------+-----------+----------+-------------------+  PTV       Full                                                                  +---------+---------------+---------+-----------+----------+-------------------+  PERO      Full                                             Unable to visualize                                                              the proximal region  +---------+---------------+---------+-----------+----------+-------------------+   Right Technical Findings: Not visualized segments include proximal PTV and proximal Peroneal. Unable to visualize due to open wound in the mid calf at the site of the necrotic tissue.  +----+---------------+---------+-----------+----------+-------+  LEFT Compressibility Phasicity Spontaneity Properties Summary  +----+---------------+---------+-----------+----------+-------+  CFV  Full            Yes       Yes                              +----+---------------+---------+-----------+----------+-------+  SFJ  Full                                                      +----+---------------+---------+-----------+----------+-------+  Summary: Right: There is no evidence of deep vein thrombosis in the lower extremity. However, portions of this examination were limited- see technical findings listed above. Left: There is no evidence of a common femoral vein obstruction.  *See table(s) above for measurements and observations. Electronically signed by Lemar LivingsBrandon Cain MD on 04/08/2019 at 9:36:41 PM.    Final         Scheduled Meds:  heparin injection (subcutaneous)  5,000 Units Subcutaneous Q8H   Continuous Infusions:  sodium chloride 75 mL/hr at 04/09/19 1800   piperacillin-tazobactam (ZOSYN)  IV 3.375 g (04/10/19 0429)   vancomycin 750 mg (04/10/19 0510)     LOS: 2 days   Time spent= 35 mins    Lada Fulbright Joline Maxcyhirag Josede Cicero, MD [facility]  If 7PM-7AM, please contact night-coverage [email or web address] 04/10/2019, 11:12 AM

## 2019-04-10 NOTE — Plan of Care (Signed)
Progressing well.

## 2019-04-11 DIAGNOSIS — A419 Sepsis, unspecified organism: Principal | ICD-10-CM

## 2019-04-11 LAB — COMPREHENSIVE METABOLIC PANEL
ALT: 82 U/L — ABNORMAL HIGH (ref 0–44)
AST: 88 U/L — ABNORMAL HIGH (ref 15–41)
Albumin: 2.1 g/dL — ABNORMAL LOW (ref 3.5–5.0)
Alkaline Phosphatase: 225 U/L — ABNORMAL HIGH (ref 38–126)
Anion gap: 9 (ref 5–15)
BUN: 11 mg/dL (ref 8–23)
CO2: 25 mmol/L (ref 22–32)
Calcium: 7.9 mg/dL — ABNORMAL LOW (ref 8.9–10.3)
Chloride: 105 mmol/L (ref 98–111)
Creatinine, Ser: 0.78 mg/dL (ref 0.61–1.24)
GFR calc Af Amer: >60 mL/min (ref >60)
GFR calc non Af Amer: >60 mL/min (ref >60)
Glucose, Bld: 86 mg/dL (ref 70–99)
Potassium: 3.5 mmol/L (ref 3.5–5.1)
Sodium: 139 mmol/L (ref 135–145)
Total Bilirubin: 0.8 mg/dL (ref 0.3–1.2)
Total Protein: 6.2 g/dL — ABNORMAL LOW (ref 6.5–8.1)

## 2019-04-11 LAB — VANCOMYCIN, PEAK: Vancomycin Pk: 20 ug/mL — ABNORMAL LOW (ref 30–40)

## 2019-04-11 LAB — MAGNESIUM: Magnesium: 2 mg/dL (ref 1.7–2.4)

## 2019-04-11 MED ORDER — POTASSIUM CHLORIDE CRYS ER 20 MEQ PO TBCR
40.0000 meq | EXTENDED_RELEASE_TABLET | Freq: Once | ORAL | Status: AC
  Administered 2019-04-11: 11:00:00 40 meq via ORAL
  Filled 2019-04-11: qty 2

## 2019-04-11 NOTE — Progress Notes (Signed)
PROGRESS NOTE    Clyde CanterburyValera Dumont  ZOX:[id number] DOB: 06/30/1956 DOA: 04/08/2019 PCP: Patient, No Pcp Per   Brief Narrative:  63 year old with no known past medical history came to the hospital with complains of right lower extremity swelling and erythema for the past 2-3 days which has been progressing.  Patient states he had spider bite about a week ago and that area has continued to worsen.  Does not take any medications at home.  Dopplers negative for DVT but MRI showed mild fasciitis involving superficial posterior compartment with extensive cellulitis.   Assessment & Plan:   Principal Problem:   Sepsis (HCC) Active Problems:   Cellulitis of right lower extremity   Myofasciitis   Elevated liver enzymes  Sepsis Right lower extremity extensive cellulitis with mild fasciitis involving superior posterior compartment; POA Fascial fluid extending superiorly into popliteal fossa - MRI findings consistent.  IV vancomycin/Zosyn day 4 -Wound care and ortho following.  -Continue supportive care -Lower extremity Dopplers-negative -Pain control, bowel regimen  Mild transaminitis -Resolved.  Continue monitor.  DVT prophylaxis: Hep SQ Code Status: Full  Family Communication:  None at bedside Disposition Plan: Maintain Hosp Stay for IV Abx  Consultants:   Ortho  Procedures:   None  Antimicrobials:   Vanc  D4  Zosyn D4   Subjective: Feels a little better, able to dorsiflex his ankles without much pain.  Still having quite a draining from his wound. Nursing at bedside changing his dressing.   Review of Systems Otherwise negative except as per HPI, including: General = no fevers, chills, dizziness, malaise, fatigue HEENT/EYES = negative for pain, redness, loss of vision, double vision, blurred vision, loss of hearing, sore throat, hoarseness, dysphagia Cardiovascular= negative for chest pain, palpitation, murmurs, lower extremity swelling Respiratory/lungs= negative for  shortness of breath, cough, hemoptysis, wheezing, mucus production Gastrointestinal= negative for nausea, vomiting,, abdominal pain, melena, hematemesis Genitourinary= negative for Dysuria, Hematuria, Change in Urinary Frequency MSK = Negative for arthralgia, myalgias, Back Pain, Joint swelling  Neurology= Negative for headache, seizures, numbness, tingling  Psychiatry= Negative for anxiety, depression, suicidal and homocidal ideation Allergy/Immunology= Medication/Food allergy as listed  Skin= Negative for Rash, lesions, ulcers, itching  Objective: Vitals:   04/10/19 0427 04/10/19 1307 04/10/19 2052 04/11/19 0442  BP: 106/64 (!) 103/59 110/71 132/74  Pulse: 77 72 74 65  Resp: 16 18 17 16   Temp: (!) 101.3 F (38.5 C) 98.5 F (36.9 C) 99.7 F (37.6 C) 98.9 F (37.2 C)  TempSrc: Oral Oral Oral Oral  SpO2: 98% 100% 97% 96%  Weight:    63.3 kg  Height:        Intake/Output Summary (Last 24 hours) at 04/11/2019 1114 Last data filed at 04/11/2019 0600 Gross per 24 hour  Intake 1708.35 ml  Output 2000 ml  Net -291.65 ml   Filed Weights   04/08/19 1317 04/09/19 0500 04/11/19 0442  Weight: 68 kg 64.5 kg 63.3 kg    Examination:  Constitutional: NAD, calm, comfortable Eyes: PERRL, lids and conjunctivae normal ENMT: Mucous membranes are moist. Posterior pharynx clear of any exudate or lesions.Normal dentition.  Neck: normal, supple, no masses, no thyromegaly Respiratory: clear to auscultation bilaterally, no wheezing, no crackles. Normal respiratory effort. No accessory muscle use.  Cardiovascular: Regular rate and rhythm, no murmurs / rubs / gallops. No extremity edema. 2+ pedal pulses. No carotid bruits.  Abdomen: no tenderness, no masses palpated. No hepatosplenomegaly. Bowel sounds positive.  Musculoskeletal: no clubbing / cyanosis. No joint deformity upper and lower  extremities. Good ROM, no contractures. Normal muscle tone.  He is able to dorsiflex his right foot better without  much pain today. Skin: Extensive cellulitis of the right lower extremity, erythema slightly appears to be improving but still has significant amount of swelling and superficial drainage of the wound. Neurologic: CN 2-12 grossly intact. Sensation intact, DTR normal. Strength 5/5 in all 4.  Psychiatric: Normal judgment and insight. Alert and oriented x 3. Normal mood.    Data Reviewed:   CBC: Recent Labs  Lab 04/08/19 1437 04/09/19 0329  WBC 37.2* 31.6*  NEUTROABS 34.6*  --   HGB 13.2 11.1*  HCT 41.2 33.4*  MCV 93.2 93.0  PLT 348 336   Basic Metabolic Panel: Recent Labs  Lab 04/08/19 1437 04/09/19 0329 04/10/19 0237 04/11/19 0337  NA 138 138  --  139  K 3.9 3.6  --  3.5  CL 103 107  --  105  CO2 26 22  --  25  GLUCOSE 92 96  --  86  BUN 37* 23  --  11  CREATININE 1.01 0.89 0.81 0.78  CALCIUM 8.5* 7.6*  --  7.9*  MG  --   --   --  2.0   GFR: Estimated Creatinine Clearance: 83.5 mL/min (by C-G formula based on SCr of 0.78 mg/dL). Liver Function Tests: Recent Labs  Lab 04/08/19 1437 04/09/19 0329 04/11/19 0337  AST 66* 71* 88*  ALT 61* 57* 82*  ALKPHOS 154* 182* 225*  BILITOT 0.6 0.7 0.8  PROT 7.7 6.1* 6.2*  ALBUMIN 3.3* 2.5* 2.1*   No results for input(s): LIPASE, AMYLASE in the last 168 hours. No results for input(s): AMMONIA in the last 168 hours. Coagulation Profile: No results for input(s): INR, PROTIME in the last 168 hours. Cardiac Enzymes: No results for input(s): CKTOTAL, CKMB, CKMBINDEX, TROPONINI in the last 168 hours. BNP (last 3 results) No results for input(s): PROBNP in the last 8760 hours. HbA1C: No results for input(s): HGBA1C in the last 72 hours. CBG: No results for input(s): GLUCAP in the last 168 hours. Lipid Profile: No results for input(s): CHOL, HDL, LDLCALC, TRIG, CHOLHDL, LDLDIRECT in the last 72 hours. Thyroid Function Tests: No results for input(s): TSH, T4TOTAL, FREET4, T3FREE, THYROIDAB in the last 72 hours. Anemia Panel:  No results for input(s): VITAMINB12, FOLATE, FERRITIN, TIBC, IRON, RETICCTPCT in the last 72 hours. Sepsis Labs: Recent Labs  Lab 04/08/19 1437  LATICACIDVEN 1.5    Recent Results (from the past 240 hour(s))  Blood culture (routine x 2)     Status: None (Preliminary result)   Collection Time: 04/08/19  1:07 PM   Specimen: BLOOD  Result Value Ref Range Status   Specimen Description   Final    BLOOD RIGHT ARM Performed at [facility], [address] [place], [place] [address]    Special Requests   Final    BOTTLES DRAWN AEROBIC AND ANAEROBIC Blood Culture adequate volume Performed at [facility], [address] [place], [place] [address]    Culture   Final    NO GROWTH 3 DAYS Performed at [facility], [address] [place], [place] [address]    Report Status PENDING  Incomplete  Blood culture (routine x 2)     Status: None (Preliminary result)   Collection Time: 04/08/19  2:39 PM   Specimen: BLOOD RIGHT FOREARM  Result Value Ref Range Status   Specimen Description   Final  BLOOD RIGHT FOREARM Performed at [facility], [address] [place], [place] [address]    Special Requests   Final    BOTTLES DRAWN AEROBIC AND ANAEROBIC Blood Culture adequate volume Performed at [facility], [address] [place], [place] [address]    Culture   Final    NO GROWTH 3 DAYS Performed at [facility], [address] [place], [place] [address]    Report Status PENDING  Incomplete  SARS Coronavirus 2 (CEPHEID - Performed in [facility] hospital lab), Hosp Order     Status: None   Collection Time: 04/08/19  6:04 PM   Specimen: Nasopharyngeal Swab  Result Value Ref Range Status   SARS Coronavirus 2 NEGATIVE NEGATIVE Final    Comment: (NOTE) If result is NEGATIVE SARS-CoV-2 target nucleic acids are NOT DETECTED. The SARS-CoV-2 RNA is generally detectable in upper and lower  respiratory specimens  during the acute phase of infection. The lowest  concentration of SARS-CoV-2 viral copies this assay can detect is 250  copies / mL. A negative result does not preclude SARS-CoV-2 infection  and should not be used as the sole basis for treatment or other  patient management decisions.  A negative result may occur with  improper specimen collection / handling, submission of specimen other  than nasopharyngeal swab, presence of viral mutation(s) within the  areas targeted by this assay, and inadequate number of viral copies  (<250 copies / mL). A negative result must be combined with clinical  observations, patient history, and epidemiological information. If result is POSITIVE SARS-CoV-2 target nucleic acids are DETECTED. The SARS-CoV-2 RNA is generally detectable in upper and lower  respiratory specimens dur ing the acute phase of infection.  Positive  results are indicative of active infection with SARS-CoV-2.  Clinical  correlation with patient history and other diagnostic information is  necessary to determine patient infection status.  Positive results do  not rule out bacterial infection or co-infection with other viruses. If result is PRESUMPTIVE POSTIVE SARS-CoV-2 nucleic acids MAY BE PRESENT.   A presumptive positive result was obtained on the submitted specimen  and confirmed on repeat testing.  While 2019 novel coronavirus  (SARS-CoV-2) nucleic acids may be present in the submitted sample  additional confirmatory testing may be necessary for epidemiological  and / or clinical management purposes  to differentiate between  SARS-CoV-2 and other Sarbecovirus currently known to infect humans.  If clinically indicated additional testing with an alternate test  methodology [phone]([id number]) is advised. The SARS-CoV-2 RNA is generally  detectable in upper and lower respiratory sp ecimens during the acute  phase of infection. The expected result is Negative. Fact Sheet for Patients:   BoilerBrush.com.cyhttps://www.fda.gov/media/136312/download Fact Sheet for Healthcare Providers: https://pope.com/https://www.fda.gov/media/136313/download This test is not yet approved or cleared by the [place] FDA and has been authorized for detection and/or diagnosis of SARS-CoV-2 by FDA under an Emergency Use Authorization (EUA).  This EUA will remain in effect (meaning this test can be used) for the duration of the COVID-19 declaration under Section 564(b)(1) of the Act, 21 U.S.C. section 360bbb-3(b)(1), unless the authorization is terminated or revoked sooner. Performed at [facility], [address] [place], [place] [address]          Radiology Studies: No results found.      Scheduled Meds: . heparin injection (subcutaneous)  5,000 Units Subcutaneous Q8H   Continuous Infusions: . sodium chloride Stopped (04/11/19 0354)  . piperacillin-tazobactam (ZOSYN)  IV Stopped (04/11/19 0540)  . vancomycin 750 mg (04/11/19 0634)     LOS: 3 days   Time spent= 35 mins    Kalijah Zeiss Arsenio Loader, MD [facility]  If 7PM-7AM, please contact night-coverage [email or web address] 04/11/2019, 11:14 AM

## 2019-04-11 NOTE — Progress Notes (Signed)
Pharmacy Antibiotic Note  Evan Gray is a 63 y.o. male admitted on 04/08/2019 with extensive cellulitis and mild fasciitis of RLE.  Pharmacy was consulted for Zosyn and vancomycin dosing. Today beginning D#4 of Zosyn 3.375 g IV q8h (4 hour infusion) and vancomycin 750 mg q12h (est AUC 512).    Ortho assessment noted - reportedly may ultimately need I&D.  Afeb Impressive leukocytosis (last WBC on 6/26) SCr WNL and essentially stable   Plan: Continue Zosyn 3.375 grams IV q8h (4-hour infusion) Continue vancomycin 750 mg IV q12h  Check peak and trough following tonight's 8 pm dose. Goal AUC 400-550. Follow daily SCr while on combination of Zosyn and vanco Follow clinical course  Height: 5\' 6"  (167.6 cm) Weight: 139 lb 8.8 oz (63.3 kg) IBW/kg (Calculated) : 63.8  Temp (24hrs), Avg:99 F (37.2 C), Min:98.5 F (36.9 C), Max:99.7 F (37.6 C)  Recent Labs  Lab 04/08/19 1437 04/09/19 0329 04/10/19 0237 04/11/19 0337  WBC 37.2* 31.6*  --   --   CREATININE 1.01 0.89 0.81 0.78  LATICACIDVEN 1.5  --   --   --     Estimated Creatinine Clearance: 83.5 mL/min (by C-G formula based on SCr of 0.78 mg/dL).    No Known Allergies   Antimicrobials this admission: 6/25 Zosyn >>  6/25 vancomycin >>   Dose adjustments this admission:   Microbiology results: 6/25 SARSCoV2: negative 6/25 BCx: ngtd   Thank you for allowing pharmacy to be a part of this patient's care.  Clayburn Pert, PharmD, BCPS [phone] 04/11/2019  9:40 AM

## 2019-04-11 NOTE — Plan of Care (Signed)
  Problem: Health Behavior/Discharge Planning: Goal: Ability to manage health-related needs will improve Outcome: Progressing   Problem: Clinical Measurements: Goal: Ability to maintain clinical measurements within normal limits will improve Outcome: Progressing Goal: Will remain free from infection Outcome: Progressing Goal: Diagnostic test results will improve Outcome: Progressing Goal: Respiratory complications will improve Outcome: Progressing Goal: Cardiovascular complication will be avoided Outcome: Progressing   Problem: Activity: Goal: Risk for activity intolerance will decrease Outcome: Progressing   Problem: Nutrition: Goal: Adequate nutrition will be maintained Outcome: Progressing   Problem: Coping: Goal: Level of anxiety will decrease Outcome: Progressing   Problem: Elimination: Goal: Will not experience complications related to bowel motility Outcome: Progressing   Problem: Pain Managment: Goal: General experience of comfort will improve Outcome: Progressing   Problem: Safety: Goal: Ability to remain free from injury will improve Outcome: Progressing   Problem: Skin Integrity: Goal: Risk for impaired skin integrity will decrease Outcome: Progressing   Problem: Clinical Measurements: Goal: Ability to avoid or minimize complications of infection will improve Outcome: Progressing   Problem: Skin Integrity: Goal: Skin integrity will improve Outcome: Progressing  Plan of care discussed with patient

## 2019-04-12 LAB — CBC
HCT: 33.8 % — ABNORMAL LOW (ref 39.0–52.0)
Hemoglobin: 10.7 g/dL — ABNORMAL LOW (ref 13.0–17.0)
MCH: 30.1 pg (ref 26.0–34.0)
MCHC: 31.7 g/dL (ref 30.0–36.0)
MCV: 94.9 fL (ref 80.0–100.0)
Platelets: 611 10*3/uL — ABNORMAL HIGH (ref 150–400)
RBC: 3.56 MIL/uL — ABNORMAL LOW (ref 4.22–5.81)
RDW: 14.6 % (ref 11.5–15.5)
WBC: 14.5 10*3/uL — ABNORMAL HIGH (ref 4.0–10.5)
nRBC: 0 % (ref 0.0–0.2)

## 2019-04-12 LAB — COMPREHENSIVE METABOLIC PANEL
ALT: 70 U/L — ABNORMAL HIGH (ref 0–44)
AST: 58 U/L — ABNORMAL HIGH (ref 15–41)
Albumin: 2.3 g/dL — ABNORMAL LOW (ref 3.5–5.0)
Alkaline Phosphatase: 192 U/L — ABNORMAL HIGH (ref 38–126)
Anion gap: 10 (ref 5–15)
BUN: 13 mg/dL (ref 8–23)
CO2: 24 mmol/L (ref 22–32)
Calcium: 7.8 mg/dL — ABNORMAL LOW (ref 8.9–10.3)
Chloride: 102 mmol/L (ref 98–111)
Creatinine, Ser: 0.8 mg/dL (ref 0.61–1.24)
GFR calc Af Amer: >60 mL/min (ref >60)
GFR calc non Af Amer: >60 mL/min (ref >60)
Glucose, Bld: 90 mg/dL (ref 70–99)
Potassium: 4 mmol/L (ref 3.5–5.1)
Sodium: 136 mmol/L (ref 135–145)
Total Bilirubin: 0.8 mg/dL (ref 0.3–1.2)
Total Protein: 6.5 g/dL (ref 6.5–8.1)

## 2019-04-12 LAB — VANCOMYCIN, TROUGH: Vancomycin Tr: 12 ug/mL — ABNORMAL LOW (ref 15–20)

## 2019-04-12 LAB — MAGNESIUM: Magnesium: 2.1 mg/dL (ref 1.7–2.4)

## 2019-04-12 MED ORDER — VANCOMYCIN HCL 1000 MG IV SOLR
1000.0000 mg | Freq: Two times a day (BID) | INTRAVENOUS | Status: DC
  Administered 2019-04-12 – 2019-04-13 (×2): 1000 mg via INTRAVENOUS
  Filled 2019-04-12 (×4): qty 1000

## 2019-04-12 NOTE — Progress Notes (Signed)
PROGRESS NOTE    Evan Gray  KGU:[id number] DOB: May 02, 1956 DOA: 04/08/2019 PCP: Patient, No Pcp Per   Brief Narrative:  63 year old with no known past medical history came to the hospital with complains of right lower extremity swelling and erythema for the past 2-3 days which has been progressing.  Patient states he had spider bite about a week ago and that area has continued to worsen.  Does not take any medications at home.  Dopplers negative for DVT but MRI showed mild fasciitis involving superficial posterior compartment with extensive cellulitis.   Assessment & Plan:   Principal Problem:   Sepsis ([place]) Active Problems:   Cellulitis of right lower extremity   Myofasciitis   Elevated liver enzymes  Sepsis Right lower extremity extensive cellulitis with mild fasciitis involving superior posterior compartment; POA Fascial fluid extending superiorly into popliteal fossa; persist - MRI findings consistent.  IV vancomycin/Zosyn day 5. Will have to get ID involved at closer to his discharge for final Abx recs.  -Wound care and ortho following.  -Continue supportive care -Lower extremity Dopplers-negative -Pain control, bowel regimen  Mild transaminitis -Resolved.  Continue monitor.  DVT prophylaxis: Hep SQ Code Status: Full  Family Communication:  None at bedside Disposition Plan: Given the extent of his cellulitis and deeper involvement, continue IV antibiotics.  Consultants:   Ortho  Procedures:   None  Antimicrobials:   Vanc  D5  Zosyn D5   Subjective: Feels ok, Able to dorsiflex his foot.   Review of Systems Otherwise negative except as per HPI, including: General = no fevers, chills, dizziness, malaise, fatigue HEENT/EYES = negative for pain, redness, loss of vision, double vision, blurred vision, loss of hearing, sore throat, hoarseness, dysphagia Cardiovascular= negative for chest pain, palpitation, murmurs, lower extremity swelling  Respiratory/lungs= negative for shortness of breath, cough, hemoptysis, wheezing, mucus production Gastrointestinal= negative for nausea, vomiting,, abdominal pain, melena, hematemesis Genitourinary= negative for Dysuria, Hematuria, Change in Urinary Frequency MSK = Negative for arthralgia, myalgias, Back Pain, Joint swelling  Neurology= Negative for headache, seizures, numbness, tingling  Psychiatry= Negative for anxiety, depression, suicidal and homocidal ideation Allergy/Immunology= Medication/Food allergy as listed  Skin= swelling of the RLE   Objective: Vitals:   04/11/19 1353 04/11/19 2017 04/12/19 0400 04/12/19 1042  BP: 128/75 97/68 (!) 93/57 97/68  Pulse: 75 75 71 72  Resp: 18 18 18 14   Temp: 99.8 F (37.7 C) 99.2 F (37.3 C) 99.1 F (37.3 C) 97.7 F (36.5 C)  TempSrc:  Oral Oral Oral  SpO2: 96% 96% 95% 90%  Weight:      Height:        Intake/Output Summary (Last 24 hours) at 04/12/2019 1052 Last data filed at 04/12/2019 1000 Gross per 24 hour  Intake 1353.75 ml  Output 1625 ml  Net -271.25 ml   Filed Weights   04/08/19 1317 04/09/19 0500 04/11/19 0442  Weight: 68 kg 64.5 kg 63.3 kg    Examination:  Constitutional: NAD, calm, comfortable Eyes: PERRL, lids and conjunctivae normal ENMT: Mucous membranes are moist. Posterior pharynx clear of any exudate or lesions.Normal dentition.  Neck: normal, supple, no masses, no thyromegaly Respiratory: clear to auscultation bilaterally, no wheezing, no crackles. Normal respiratory effort. No accessory muscle use.  Cardiovascular: Regular rate and rhythm, no murmurs / rubs / gallops. No extremity edema. 2+ pedal pulses. No carotid bruits.  Abdomen: no tenderness, no masses palpated. No hepatosplenomegaly. Bowel sounds positive.  Musculoskeletal: no clubbing / cyanosis. No joint deformity upper and lower  extremities. Good ROM, no contractures. Normal muscle tone.  Skin: Extensive cellulitis of the right lower extremity with  superficial drainage.  Dressing in place.  Able to dorsiflex his foot and able to lift it. Neurologic: CN 2-12 grossly intact. Sensation intact, DTR normal. Strength 5/5 in all 4.  Psychiatric: Normal judgment and insight. Alert and oriented x 3. Normal mood.     Data Reviewed:   CBC: Recent Labs  Lab 04/08/19 1437 04/09/19 0329 04/12/19 0613  WBC 37.2* 31.6* 14.5*  NEUTROABS 34.6*  --   --   HGB 13.2 11.1* 10.7*  HCT 41.2 33.4* 33.8*  MCV 93.2 93.0 94.9  PLT 348 336 611*   Basic Metabolic Panel: Recent Labs  Lab 04/08/19 1437 04/09/19 0329 04/10/19 0237 04/11/19 0337 04/12/19 0613  NA 138 138  --  139 136  K 3.9 3.6  --  3.5 4.0  CL 103 107  --  105 102  CO2 26 22  --  25 24  GLUCOSE 92 96  --  86 90  BUN 37* 23  --  11 13  CREATININE 1.01 0.89 0.81 0.78 0.80  CALCIUM 8.5* 7.6*  --  7.9* 7.8*  MG  --   --   --  2.0 2.1   GFR: Estimated Creatinine Clearance: 83.5 mL/min (by C-G formula based on SCr of 0.8 mg/dL). Liver Function Tests: Recent Labs  Lab 04/08/19 1437 04/09/19 0329 04/11/19 0337 04/12/19 0613  AST 66* 71* 88* 58*  ALT 61* 57* 82* 70*  ALKPHOS 154* 182* 225* 192*  BILITOT 0.6 0.7 0.8 0.8  PROT 7.7 6.1* 6.2* 6.5  ALBUMIN 3.3* 2.5* 2.1* 2.3*   No results for input(s): LIPASE, AMYLASE in the last 168 hours. No results for input(s): AMMONIA in the last 168 hours. Coagulation Profile: No results for input(s): INR, PROTIME in the last 168 hours. Cardiac Enzymes: No results for input(s): CKTOTAL, CKMB, CKMBINDEX, TROPONINI in the last 168 hours. BNP (last 3 results) No results for input(s): PROBNP in the last 8760 hours. HbA1C: No results for input(s): HGBA1C in the last 72 hours. CBG: No results for input(s): GLUCAP in the last 168 hours. Lipid Profile: No results for input(s): CHOL, HDL, LDLCALC, TRIG, CHOLHDL, LDLDIRECT in the last 72 hours. Thyroid Function Tests: No results for input(s): TSH, T4TOTAL, FREET4, T3FREE, THYROIDAB in the  last 72 hours. Anemia Panel: No results for input(s): VITAMINB12, FOLATE, FERRITIN, TIBC, IRON, RETICCTPCT in the last 72 hours. Sepsis Labs: Recent Labs  Lab 04/08/19 1437  LATICACIDVEN 1.5    Recent Results (from the past 240 hour(s))  Blood culture (routine x 2)     Status: None (Preliminary result)   Collection Time: 04/08/19  1:07 PM   Specimen: BLOOD  Result Value Ref Range Status   Specimen Description   Final    BLOOD RIGHT ARM Performed at [facility], [address] [place], [place] [address]    Special Requests   Final    BOTTLES DRAWN AEROBIC AND ANAEROBIC Blood Culture adequate volume Performed at [facility], [address] [place], [place] [address]    Culture   Final    NO GROWTH 3 DAYS Performed at [facility], [address] [place], [place] [address]    Report Status PENDING  Incomplete  Blood culture (routine x 2)     Status: None (Preliminary result)   Collection Time: 04/08/19  2:39 PM   Specimen: BLOOD RIGHT FOREARM  Result Value Ref Range Status   Specimen Description   Final    BLOOD RIGHT FOREARM Performed at [facility], [address] [place], [place] [address]    Special Requests   Final    BOTTLES DRAWN AEROBIC AND ANAEROBIC Blood Culture adequate volume Performed at [facility], [address] [place], [place] [address]    Culture   Final    NO GROWTH 3 DAYS Performed at [facility], [address] [place], [place] [address]    Report Status PENDING  Incomplete  SARS Coronavirus 2 (CEPHEID - Performed in [facility]), Hosp Order     Status: None   Collection Time: 04/08/19  6:04 PM   Specimen: Nasopharyngeal Swab  Result Value Ref Range Status   SARS Coronavirus 2 NEGATIVE NEGATIVE Final    Comment: (NOTE) If result is NEGATIVE SARS-CoV-2 target nucleic acids are NOT DETECTED. The SARS-CoV-2 RNA is generally detectable in upper and  lower  respiratory specimens during the acute phase of infection. The lowest  concentration of SARS-CoV-2 viral copies this assay can detect is 250  copies / mL. A negative result does not preclude SARS-CoV-2 infection  and should not be used as the sole basis for treatment or other  patient management decisions.  A negative result may occur with  improper specimen collection / handling, submission of specimen other  than nasopharyngeal swab, presence of viral mutation(s) within the  areas targeted by this assay, and inadequate number of viral copies  (<250 copies / mL). A negative result must be combined with clinical  observations, patient history, and epidemiological information. If result is POSITIVE SARS-CoV-2 target nucleic acids are DETECTED. The SARS-CoV-2 RNA is generally detectable in upper and lower  respiratory specimens dur ing the acute phase of infection.  Positive  results are indicative of active infection with SARS-CoV-2.  Clinical  correlation with patient history and other diagnostic information is  necessary to determine patient infection status.  Positive results do  not rule out bacterial infection or co-infection with other viruses. If result is PRESUMPTIVE POSTIVE SARS-CoV-2 nucleic acids MAY BE PRESENT.   A presumptive positive result was obtained on the submitted specimen  and confirmed on repeat testing.  While 2019 novel coronavirus  (SARS-CoV-2) nucleic acids may be present in the submitted sample  additional confirmatory testing may be necessary for epidemiological  and / or clinical management purposes  to differentiate between  SARS-CoV-2 and other Sarbecovirus currently known to infect humans.  If clinically indicated additional testing with an alternate test  methodology [phone]([id number]) is advised. The SARS-CoV-2 RNA is generally  detectable in upper and lower respiratory sp ecimens during the acute  phase of infection. The expected result is Negative.  Fact Sheet for Patients:  BoilerBrush.com.cyhttps://www.fda.gov/media/136312/download Fact Sheet for Healthcare Providers: https://pope.com/https://www.fda.gov/media/136313/download This test is not yet approved or cleared by the [place] FDA and has been authorized for detection and/or diagnosis of SARS-CoV-2 by FDA under an Emergency Use Authorization (EUA).  This EUA will remain in effect (meaning this test can be used) for the duration of the COVID-19 declaration under Section 564(b)(1) of the Act, 21 U.S.C. section 360bbb-3(b)(1), unless the authorization is terminated or revoked sooner. Performed at [facility], [address] [place], [place] [address]          Radiology Studies: No results found.      Scheduled Meds: . heparin injection (subcutaneous)  5,000 Units Subcutaneous  Q8H   Continuous Infusions: . sodium chloride 75 mL/hr at 04/12/19 1043  . piperacillin-tazobactam (ZOSYN)  IV Stopped (04/12/19 0618)  . vancomycin       LOS: 4 days   Time spent= 35 mins    Jakerria Kingbird Joline Maxcyhirag Zuzanna Maroney, MD [facility]  If 7PM-7AM, please contact night-coverage [email or web address] 04/12/2019, 10:52 AM

## 2019-04-12 NOTE — Plan of Care (Signed)
  Problem: Health Behavior/Discharge Planning: Goal: Ability to manage health-related needs will improve Outcome: Progressing   Problem: Clinical Measurements: Goal: Ability to maintain clinical measurements within normal limits will improve Outcome: Progressing Goal: Will remain free from infection Outcome: Progressing Goal: Diagnostic test results will improve Outcome: Progressing Goal: Respiratory complications will improve Outcome: Progressing Goal: Cardiovascular complication will be avoided Outcome: Progressing   Problem: Activity: Goal: Risk for activity intolerance will decrease Outcome: Progressing   Problem: Nutrition: Goal: Adequate nutrition will be maintained Outcome: Progressing   Problem: Coping: Goal: Level of anxiety will decrease Outcome: Progressing   Problem: Elimination: Goal: Will not experience complications related to bowel motility Outcome: Progressing   Problem: Pain Managment: Goal: General experience of comfort will improve Outcome: Progressing   Problem: Safety: Goal: Ability to remain free from injury will improve Outcome: Progressing   Problem: Skin Integrity: Goal: Risk for impaired skin integrity will decrease Outcome: Progressing   Problem: Clinical Measurements: Goal: Ability to avoid or minimize complications of infection will improve Outcome: Progressing   Problem: Skin Integrity: Goal: Skin integrity will improve Outcome: Progressing

## 2019-04-12 NOTE — Progress Notes (Signed)
Pharmacy Antibiotic Note  Evan Gray is a 63 y.o. male admitted on 04/08/2019 with extensive cellulitis and mild fasciitis of RLE.  Pharmacy was consulted for piperacillin/tazobactam and vancomycin dosing. Today beginning D#5 of Zosyn 3.375 g IV q8h (4 hour infusion) and vancomycin 750 mg IV q12h.    Ortho assessment noted - reportedly may ultimately need I&D.  Assessment: Today, 04/12/19   WBC 14.5 - slightly elevated. Significantly improved  SCr 0.8 - stable, WNL. CrCl ~ 80 mL/min  Afebrile  Vancomycin levels obtained to calculate AUC:  Vancomycin peak (04/11/19 @ 20:21 = 20)  Vancomycin trough (04/12/19 @ 06:13 = 12)  Calculated AUC 391 is slightly below goal of AUC 400-500  Vancomycin trough obtained ~ 5 mins into vancomycin infusion but should not significantly impact level  Plan:  Continue piperacillin/tazobactam 3.375 grams IV q8h (4-hour infusion)  Increase vancomycin to 1000 mg IV q12h for estimated AUC of 522 and VT 16 mcg/mL  Follow daily SCr while on combination of Zosyn and vanco  Follow clinical course and culture data  Height: 5\' 6"  (167.6 cm) Weight: 139 lb 8.8 oz (63.3 kg) IBW/kg (Calculated) : 63.8  Temp (24hrs), Avg:99.4 F (37.4 C), Min:99.1 F (37.3 C), Max:99.8 F (37.7 C)  Recent Labs  Lab 04/08/19 1437 04/09/19 0329 04/10/19 0237 04/11/19 0337 04/11/19 2021 04/12/19 0613  WBC 37.2* 31.6*  --   --   --  14.5*  CREATININE 1.01 0.89 0.81 0.78  --  0.80  LATICACIDVEN 1.5  --   --   --   --   --   VANCOTROUGH  --   --   --   --   --  12*  VANCOPEAK  --   --   --   --  20*  --     Estimated Creatinine Clearance: 83.5 mL/min (by C-G formula based on SCr of 0.8 mg/dL).    No Known Allergies   Antimicrobials this admission: 6/25 Zosyn >>  6/25 vancomycin >>   Dose adjustments this admission: 6/29: Vancomycin 750 mg IV q12h --> 1000 mg IV q12h  Microbiology results: 6/25 SARSCoV2: negative 6/25 BCx: no growth 3 days  Thank you  for allowing pharmacy to be a part of this patient's care.  Lenis Noon, PharmD 04/12/19 9:29 AM

## 2019-04-13 DIAGNOSIS — M1990 Unspecified osteoarthritis, unspecified site: Secondary | ICD-10-CM | POA: Diagnosis present

## 2019-04-13 DIAGNOSIS — Z9889 Other specified postprocedural states: Secondary | ICD-10-CM

## 2019-04-13 DIAGNOSIS — Z872 Personal history of diseases of the skin and subcutaneous tissue: Secondary | ICD-10-CM

## 2019-04-13 DIAGNOSIS — Z87891 Personal history of nicotine dependence: Secondary | ICD-10-CM

## 2019-04-13 DIAGNOSIS — Z945 Skin transplant status: Secondary | ICD-10-CM

## 2019-04-13 DIAGNOSIS — D649 Anemia, unspecified: Secondary | ICD-10-CM | POA: Diagnosis present

## 2019-04-13 DIAGNOSIS — R748 Abnormal levels of other serum enzymes: Secondary | ICD-10-CM

## 2019-04-13 LAB — CBC
HCT: 34.1 % — ABNORMAL LOW (ref 39.0–52.0)
Hemoglobin: 10.8 g/dL — ABNORMAL LOW (ref 13.0–17.0)
MCH: 30.3 pg (ref 26.0–34.0)
MCHC: 31.7 g/dL (ref 30.0–36.0)
MCV: 95.8 fL (ref 80.0–100.0)
Platelets: 696 10*3/uL — ABNORMAL HIGH (ref 150–400)
RBC: 3.56 MIL/uL — ABNORMAL LOW (ref 4.22–5.81)
RDW: 14.6 % (ref 11.5–15.5)
WBC: 9.4 10*3/uL (ref 4.0–10.5)
nRBC: 0 % (ref 0.0–0.2)

## 2019-04-13 LAB — COMPREHENSIVE METABOLIC PANEL
ALT: 61 U/L — ABNORMAL HIGH (ref 0–44)
AST: 44 U/L — ABNORMAL HIGH (ref 15–41)
Albumin: 2.4 g/dL — ABNORMAL LOW (ref 3.5–5.0)
Alkaline Phosphatase: 159 U/L — ABNORMAL HIGH (ref 38–126)
Anion gap: 9 (ref 5–15)
BUN: 14 mg/dL (ref 8–23)
CO2: 26 mmol/L (ref 22–32)
Calcium: 8.1 mg/dL — ABNORMAL LOW (ref 8.9–10.3)
Chloride: 103 mmol/L (ref 98–111)
Creatinine, Ser: 0.77 mg/dL (ref 0.61–1.24)
GFR calc Af Amer: >60 mL/min (ref >60)
GFR calc non Af Amer: >60 mL/min (ref >60)
Glucose, Bld: 89 mg/dL (ref 70–99)
Potassium: 3.9 mmol/L (ref 3.5–5.1)
Sodium: 138 mmol/L (ref 135–145)
Total Bilirubin: 0.9 mg/dL (ref 0.3–1.2)
Total Protein: 6.8 g/dL (ref 6.5–8.1)

## 2019-04-13 LAB — MAGNESIUM: Magnesium: 2.3 mg/dL (ref 1.7–2.4)

## 2019-04-13 LAB — CULTURE, BLOOD (ROUTINE X 2)
Culture: NO GROWTH
Culture: NO GROWTH
Special Requests: ADEQUATE
Special Requests: ADEQUATE

## 2019-04-13 MED ORDER — DOXYCYCLINE HYCLATE 100 MG PO TABS
100.0000 mg | ORAL_TABLET | Freq: Two times a day (BID) | ORAL | Status: DC
  Administered 2019-04-13 – 2019-04-14 (×3): 100 mg via ORAL
  Filled 2019-04-13 (×3): qty 1

## 2019-04-13 MED ORDER — AMOXICILLIN-POT CLAVULANATE 875-125 MG PO TABS
1.0000 | ORAL_TABLET | Freq: Two times a day (BID) | ORAL | Status: DC
  Administered 2019-04-13 – 2019-04-14 (×3): 1 via ORAL
  Filled 2019-04-13 (×3): qty 1
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
## 2019-04-13 NOTE — Progress Notes (Signed)
PROGRESS NOTE    Evan Gray  ONG:[id number] DOB: Jan 06, 1956 DOA: 04/08/2019 PCP: Patient, No Pcp Per   Brief Narrative:  63 year old with no known past medical history came to the hospital with complains of right lower extremity swelling and erythema for the past 2-3 days which has been progressing.  Patient states he had spider bite about a week ago and that area has continued to worsen.  Does not take any medications at home.  Dopplers negative for DVT but MRI showed mild fasciitis involving superficial posterior compartment with extensive cellulitis. Consulted Ortho and ID.   Assessment & Plan:   Principal Problem:   Sepsis (HCC) Active Problems:   Cellulitis of right lower extremity   Myofasciitis   Elevated liver enzymes  Sepsis Right lower extremity extensive cellulitis with mild fasciitis involving superior posterior compartment; POA Fascial fluid extending superiorly into popliteal fossa; slightly improving. - MRI findings consistent.  Continue IV Vanco and Zosyn.  Infectious disease consulted for further recommendations on antibiotics underlying of the treatment course. -Wound care and ortho following.  -Continue supportive care -Lower extremity Dopplers-negative -Pain control, bowel regimen  Mild transaminitis -Resolved.  Continue monitor.  DVT prophylaxis: Hep SQ Code Status: Full  Family Communication:  None at bedside Disposition Plan: Maintain hospital stay for IV antibiotics.  Patient's infection is quite deep and severe.  Infectious disease consulted.  Consultants:   Ortho  Infectious disease  Procedures:   None   Vancomycin  Zosyn   Subjective: He tells me his erythema is better and is able to dorsiflex his foot.  Still has superficial drainage around the site of his spider bite.  Review of Systems Otherwise negative except as per HPI, including: General = no fevers, chills, dizziness, malaise, fatigue HEENT/EYES = negative for pain,  redness, loss of vision, double vision, blurred vision, loss of hearing, sore throat, hoarseness, dysphagia Cardiovascular= negative for chest pain, palpitation, murmurs, lower extremity swelling Respiratory/lungs= negative for shortness of breath, cough, hemoptysis, wheezing, mucus production Gastrointestinal= negative for nausea, vomiting,, abdominal pain, melena, hematemesis Genitourinary= negative for Dysuria, Hematuria, Change in Urinary Frequency MSK = Negative for arthralgia, myalgias, Back Pain, Joint swelling  Neurology= Negative for headache, seizures, numbness, tingling  Psychiatry= Negative for anxiety, depression, suicidal and homocidal ideation Allergy/Immunology= Medication/Food allergy as listed  Skin= Negative for Rash, lesions, ulcers, itching    Objective: Vitals:   04/12/19 1101 04/12/19 1349 04/12/19 2006 04/13/19 0452  BP: 97/69 90/62 105/67 107/61  Pulse: 70 63 71 64  Resp: 18 18 20 20   Temp: 98.4 F (36.9 C) 98.1 F (36.7 C) 99.8 F (37.7 C) 98.8 F (37.1 C)  TempSrc: Oral Oral Oral Oral  SpO2: 96% 97% 100% 96%  Weight:    60.5 kg  Height:        Intake/Output Summary (Last 24 hours) at 04/13/2019 1004 Last data filed at 04/13/2019 0951 Gross per 24 hour  Intake 1821.04 ml  Output 1075 ml  Net 746.04 ml   Filed Weights   04/09/19 0500 04/11/19 0442 04/13/19 0452  Weight: 64.5 kg 63.3 kg 60.5 kg    Examination: Constitutional: NAD, calm, comfortable Eyes: PERRL, lids and conjunctivae normal ENMT: Mucous membranes are moist. Posterior pharynx clear of any exudate or lesions.Normal dentition.  Neck: normal, supple, no masses, no thyromegaly Respiratory: clear to auscultation bilaterally, no wheezing, no crackles. Normal respiratory effort. No accessory muscle use.  Cardiovascular: Regular rate and rhythm, no murmurs / rubs / gallops. No extremity edema. 2+ pedal  pulses. No carotid bruits.  Abdomen: no tenderness, no masses palpated. No  hepatosplenomegaly. Bowel sounds positive.  Musculoskeletal: no clubbing / cyanosis. No joint deformity upper and lower extremities. Good ROM, no contractures. Normal muscle tone.  Skin: Extensive cellulitis of his right lower extremity, improvement noted.  Dressing in place. Neurologic: CN 2-12 grossly intact. Sensation intact, DTR normal. Strength 5/5 in all 4.  Psychiatric: Normal judgment and insight. Alert and oriented x 3. Normal mood.     Data Reviewed:   CBC: Recent Labs  Lab 04/08/19 1437 04/09/19 0329 04/12/19 0613 04/13/19 0422  WBC 37.2* 31.6* 14.5* 9.4  NEUTROABS 34.6*  --   --   --   HGB 13.2 11.1* 10.7* 10.8*  HCT 41.2 33.4* 33.8* 34.1*  MCV 93.2 93.0 94.9 95.8  PLT 348 336 611* 696*   Basic Metabolic Panel: Recent Labs  Lab 04/08/19 1437 04/09/19 0329 04/10/19 0237 04/11/19 0337 04/12/19 0613 04/13/19 0422  NA 138 138  --  139 136 138  K 3.9 3.6  --  3.5 4.0 3.9  CL 103 107  --  105 102 103  CO2 26 22  --  25 24 26   GLUCOSE 92 96  --  86 90 89  BUN 37* 23  --  11 13 14   CREATININE 1.01 0.89 0.81 0.78 0.80 0.77  CALCIUM 8.5* 7.6*  --  7.9* 7.8* 8.1*  MG  --   --   --  2.0 2.1 2.3   GFR: Estimated Creatinine Clearance: 79.8 mL/min (by C-G formula based on SCr of 0.77 mg/dL). Liver Function Tests: Recent Labs  Lab 04/08/19 1437 04/09/19 0329 04/11/19 0337 04/12/19 0613 04/13/19 0422  AST 66* 71* 88* 58* 44*  ALT 61* 57* 82* 70* 61*  ALKPHOS 154* 182* 225* 192* 159*  BILITOT 0.6 0.7 0.8 0.8 0.9  PROT 7.7 6.1* 6.2* 6.5 6.8  ALBUMIN 3.3* 2.5* 2.1* 2.3* 2.4*   No results for input(s): LIPASE, AMYLASE in the last 168 hours. No results for input(s): AMMONIA in the last 168 hours. Coagulation Profile: No results for input(s): INR, PROTIME in the last 168 hours. Cardiac Enzymes: No results for input(s): CKTOTAL, CKMB, CKMBINDEX, TROPONINI in the last 168 hours. BNP (last 3 results) No results for input(s): PROBNP in the last 8760 hours. HbA1C:  No results for input(s): HGBA1C in the last 72 hours. CBG: No results for input(s): GLUCAP in the last 168 hours. Lipid Profile: No results for input(s): CHOL, HDL, LDLCALC, TRIG, CHOLHDL, LDLDIRECT in the last 72 hours. Thyroid Function Tests: No results for input(s): TSH, T4TOTAL, FREET4, T3FREE, THYROIDAB in the last 72 hours. Anemia Panel: No results for input(s): VITAMINB12, FOLATE, FERRITIN, TIBC, IRON, RETICCTPCT in the last 72 hours. Sepsis Labs: Recent Labs  Lab 04/08/19 1437  LATICACIDVEN 1.5    Recent Results (from the past 240 hour(s))  Blood culture (routine x 2)     Status: None   Collection Time: 04/08/19  1:07 PM   Specimen: BLOOD  Result Value Ref Range Status   Specimen Description   Final    BLOOD RIGHT ARM Performed at [facility], [address] [place], [place] [address]    Special Requests   Final    BOTTLES DRAWN AEROBIC AND ANAEROBIC Blood Culture adequate volume Performed at [facility], [address] [place], [place] [address]    Culture   Final    NO GROWTH 5 DAYS Performed at [facility],  [address] [place], [place] [address]    Report Status 04/13/2019 FINAL  Final  Blood culture (routine x 2)     Status: None   Collection Time: 04/08/19  2:39 PM   Specimen: BLOOD RIGHT FOREARM  Result Value Ref Range Status   Specimen Description   Final    BLOOD RIGHT FOREARM Performed at [facility], [place] [address] [place], [place] [address]    Special Requests   Final    BOTTLES DRAWN AEROBIC AND ANAEROBIC Blood Culture adequate volume Performed at [place] [address], [place], [place] [address]    Culture   Final    NO GROWTH 5 DAYS Performed at [facility], [place] [address], [place], [place] [address]    Report Status 04/13/2019 FINAL  Final  SARS Coronavirus 2 (CEPHEID - Performed in [place] hospital lab), Hosp Order     Status: None   Collection  Time: 04/08/19  6:04 PM   Specimen: Nasopharyngeal Swab  Result Value Ref Range Status   SARS Coronavirus 2 NEGATIVE NEGATIVE Final    Comment: (NOTE) If result is NEGATIVE SARS-CoV-2 target nucleic acids are NOT DETECTED. The SARS-CoV-2 RNA is generally detectable in upper and lower  respiratory specimens during the acute phase of infection. The lowest  concentration of SARS-CoV-2 viral copies this assay can detect is 250  copies / mL. A negative result does not preclude SARS-CoV-2 infection  and should not be used as the sole basis for treatment or other  patient management decisions.  A negative result may occur with  improper specimen collection / handling, submission of specimen other  than nasopharyngeal swab, presence of viral mutation(s) within the  areas targeted by this assay, and inadequate number of viral copies  (<250 copies / mL). A negative result must be combined with clinical  observations, patient history, and epidemiological information. If result is POSITIVE SARS-CoV-2 target nucleic acids are DETECTED. The SARS-CoV-2 RNA is generally detectable in upper and lower  respiratory specimens dur ing the acute phase of infection.  Positive  results are indicative of active infection with SARS-CoV-2.  Clinical  correlation with patient history and other diagnostic information is  necessary to determine patient infection status.  Positive results do  not rule out bacterial infection or co-infection with other viruses. If result is PRESUMPTIVE POSTIVE SARS-CoV-2 nucleic acids MAY BE PRESENT.   A presumptive positive result was obtained on the submitted specimen  and confirmed on repeat testing.  While 2019 novel coronavirus  (SARS-CoV-2) nucleic acids may be present in the submitted sample  additional confirmatory testing may be necessary for epidemiological  and / or clinical management purposes  to differentiate between  SARS-CoV-2 and other Sarbecovirus currently known  to infect humans.  If clinically indicated additional testing with an alternate test  methodology [phone]) is advised. The SARS-CoV-2 RNA is generally  detectable in upper and lower respiratory sp ecimens during the acute  phase of infection. The expected result is Negative. Fact Sheet for Patients:  StrictlyIdeas.no Fact Sheet for Healthcare Providers: BankingDealers.co.za This test is not yet approved or cleared by the [place] FDA and has been authorized for detection and/or diagnosis of SARS-CoV-2 by FDA under an Emergency Use Authorization (EUA).  This EUA will remain in effect (meaning this test can be used) for the duration of the COVID-19 declaration under Section 564(b)(1) of the Act, 21 U.S.C. section 360bbb-3(b)(1), unless the authorization is terminated or  revoked sooner. Performed at [facility], [address] [place], [place] [address]          Radiology Studies: No results found.      Scheduled Meds: . heparin injection (subcutaneous)  5,000 Units Subcutaneous Q8H   Continuous Infusions: . sodium chloride Stopped (04/12/19 1237)  . piperacillin-tazobactam (ZOSYN)  IV 3.375 g (04/13/19 0316)  . vancomycin 1,000 mg (04/13/19 0620)     LOS: 5 days   Time spent= 35 mins    Lelani Garnett Joline Maxcyhirag Kimani Bedoya, MD [facility]  If 7PM-7AM, please contact night-coverage [email or web address] 04/13/2019, 10:04 AM

## 2019-04-13 NOTE — Progress Notes (Signed)
I rechecked the leg wounds.  Unfortunately they involve previously skin grafted areas.  The wounds appear to be demarcating well.  Exam is slightly improved, certainly not worse.  Will order PT hydrotherapy and wet to dry dressings BID.  May follow up in the wound center upon hospital discharge.

## 2019-04-14 LAB — MAGNESIUM: Magnesium: 2.2 mg/dL (ref 1.7–2.4)

## 2019-04-14 LAB — CBC
HCT: 36.7 % — ABNORMAL LOW (ref 39.0–52.0)
Hemoglobin: 11.4 g/dL — ABNORMAL LOW (ref 13.0–17.0)
MCH: 30 pg (ref 26.0–34.0)
MCHC: 31.1 g/dL (ref 30.0–36.0)
MCV: 96.6 fL (ref 80.0–100.0)
Platelets: 698 10*3/uL — ABNORMAL HIGH (ref 150–400)
RBC: 3.8 MIL/uL — ABNORMAL LOW (ref 4.22–5.81)
RDW: 14.5 % (ref 11.5–15.5)
WBC: 10.6 10*3/uL — ABNORMAL HIGH (ref 4.0–10.5)
nRBC: 0 % (ref 0.0–0.2)

## 2019-04-14 LAB — BASIC METABOLIC PANEL
Anion gap: 7 (ref 5–15)
BUN: 13 mg/dL (ref 8–23)
CO2: 24 mmol/L (ref 22–32)
Calcium: 8.1 mg/dL — ABNORMAL LOW (ref 8.9–10.3)
Chloride: 106 mmol/L (ref 98–111)
Creatinine, Ser: 0.71 mg/dL (ref 0.61–1.24)
GFR calc Af Amer: >60 mL/min (ref >60)
GFR calc non Af Amer: >60 mL/min (ref >60)
Glucose, Bld: 82 mg/dL (ref 70–99)
Potassium: 4 mmol/L (ref 3.5–5.1)
Sodium: 137 mmol/L (ref 135–145)

## 2019-04-14 LAB — HEPATITIS B SURFACE ANTIGEN: Hepatitis B Surface Ag: NEGATIVE

## 2019-04-14 LAB — HEPATITIS C ANTIBODY: HCV Ab: <0.1 s/co ratio (ref 0.0–0.9)

## 2019-04-14 MED ORDER — AMOXICILLIN-POT CLAVULANATE 875-125 MG PO TABS: 1.0000 | ORAL_TABLET | Freq: Two times a day (BID) | ORAL | 0 refills | Status: AC

## 2019-04-14 MED ORDER — DOXYCYCLINE HYCLATE 100 MG PO TABS: 100.0000 mg | ORAL_TABLET | Freq: Two times a day (BID) | ORAL | 0 refills | Status: AC

## 2019-04-14 NOTE — Progress Notes (Signed)
PT NOTE  Pt seen at bedside for RLE wound assessment. Spoke with Dr. Erlinda Hong regarding findings. Given the potential for limited follow up care and that the open area is over an old graft site-- will defer hydrotherapy/sharps debridement at this time and proceed with moist to dry dressing changes with santyl to facilitate removal of minimal amount of necrotic tissue. Pt should be able to manage this at home as well.  RN updated   Please re-consult if wound should change/worsen.   Thank you, PT  Evan Gray, PT  Pager: [phone] Acute Rehab Dept [facility]): [phone]   04/14/2019

## 2019-04-14 NOTE — TOC Transition Note (Signed)
Transition of Care [facility]) - CM/SW Discharge Note   Patient Details  Name: Rolfe Hartsell MRN: [id number] Date of Birth: [birth year]  [facility] of [facility]) CM/SW Contact:  Servando Snare, LCSW Phone Number: 04/14/2019, 3:40 PM   Clinical Narrative:   [facility] arranged for patient as well as f/u appointments. Appointments added to patients AVS.     Final next level of care: [place] Barriers to Discharge: No Barriers Identified   Patient Goals and CMS Choice Patient states their goals for this hospitalization and ongoing recovery are:: return to [place] CMS Medicare.gov Compare Post Acute Care list provided to:: Patient Choice offered to / list presented to : Patient  Discharge Placement                       Discharge Plan and Services In-house Referral: NA Discharge Planning Services: NA Post Acute Care Choice: Home Health          DME Arranged: N/A DME Agency: NA       HH Arranged: RN [place] Agency: [facility] Date [place] Agency Contacted: 04/14/19 Time HH Agency Contacted: 0230 Representative spoke with at [place]: [place] ([place]) Interventions     Readmission Risk Interventions No flowsheet data found.

## 2019-04-14 NOTE — TOC Progression Note (Signed)
Transition of Care [facility]) - Initial/Assessment Note    Patient Details  Name: Evan Gray MRN: [id number] Date of Birth: [birth year]  Transition of Care [facility]) CM/SW Contact:    Servando Snare, LCSW Phone Number: 04/14/2019, 2:05 PM  Clinical Narrative:        LCSW consulted for financial resources. LCSW made referral to financial counseling.                  Patient Goals and CMS Choice        Expected Discharge Plan and Services                                                Prior Living Arrangements/Services                       Activities of Daily Living Home Assistive Devices/Equipment: None ADL Screening (condition at time of admission) Patient's cognitive ability adequate to safely complete daily activities?: Yes Is the patient deaf or have difficulty hearing?: No Does the patient have difficulty seeing, even when wearing glasses/contacts?: No Does the patient have difficulty concentrating, remembering, or making decisions?: No Patient able to express need for assistance with ADLs?: Yes Does the patient have difficulty dressing or bathing?: Yes Independently performs ADLs?: No Communication: Independent Dressing (OT): Needs assistance Is this a change from baseline?: Pre-admission baseline Grooming: Independent Is this a change from baseline?: Pre-admission baseline Feeding: Independent Bathing: Independent, Needs assistance Is this a change from baseline?: Pre-admission baseline Toileting: Independent, Needs assistance Is this a change from baseline?: Pre-admission baseline In/Out Bed: Needs assistance Is this a change from baseline?: Pre-admission baseline Walks in Home: Needs assistance Is this a change from baseline?: Pre-admission baseline Does the patient have difficulty walking or climbing stairs?: Yes Weakness of Legs: Both Weakness of Arms/Hands: None  Permission Sought/Granted                  Emotional Assessment              Admission diagnosis:  Myofasciitis [M60.9] Cellulitis of right lower extremity [L03.115] Patient Active Problem List   Diagnosis Date Noted  . Normocytic anemia 04/13/2019  . DJD (degenerative joint disease) 04/13/2019  . Status post skin graft 04/13/2019  . Sepsis ([place]) 04/11/2019  . Cellulitis of right lower extremity 04/08/2019  . Myofasciitis 04/08/2019  . Elevated liver enzymes 04/08/2019   PCP:  Patient, No Pcp Per Pharmacy:   [id number] - [place], [place] [address] [place] [address] Phone: [phone] Fax: [phone]     Social Determinants of Health (SDOH) Interventions    Readmission Risk Interventions No flowsheet data found.

## 2019-04-14 NOTE — Discharge Summary (Signed)
Physician Discharge Summary  Shannan Garfinkel RUE:[id number] DOB: Aug 13, 1956 DOA: 04/08/2019  PCP: Patient, No Pcp Per  Admit date: 04/08/2019 Discharge date: 04/14/2019  Time spent: 40 minutes  Recommendations for Outpatient Follow-up:  1. Follow outpatient CBC/CMP 2. Follow up wound care and establish with PCP outpatient (pt unfortunately leaving country in 2 days - he had PCP and wound care set up before we knew this - pt encouraged to follow up with outpatient PCP when he gets to [place])  Discharge Diagnoses:  Active Problems:   Cellulitis of right lower extremity   Myofasciitis   Elevated liver enzymes   Normocytic anemia   DJD (degenerative joint disease)   Status post skin graft   Discharge Condition: stable  Diet recommendation: heart healthy  Filed Weights   04/11/19 0442 04/13/19 0452 04/14/19 0543  Weight: 63.3 kg 60.5 kg 59.8 kg    History of present illness:  63 year old with no known past medical history came to the hospital with complains of right lower extremity swelling and erythema for the past 2-3 days which has been progressing.  Patient states he had spider bite about Sherriann Szuch week ago and that area has continued to worsen.  Does not take any medications at home.  Dopplers negative for DVT but MRI showed mild fasciitis involving superficial posterior compartment with extensive cellulitis. Consulted Ortho and ID.  He was admitted for sepsis due to RLE cellulitis with underlying myofasciitis.  He's improved now.  Plan for d/c with oral antibiotics.    Hospital Course:  Sepsis Right lower extremity extensive cellulitis with mild fasciitis involving superior posterior compartment; POA Fascial fluid extending superiorly into popliteal fossa; slightly improving. - MRI findings consistent.  Continue IV Vanco and Zosyn.  - ID recommended doxy and augmentin at discharge -Wound care and ortho following -> ortho recommending BID wet to dry dressing changes - discharge with  outpatient follow up with PCP and wound care follow up  - pt told us after outpatient f/u arranged that he was going back to [place] in about 2 days.  He was encouraged to follow up as soon as possible with provider.  Discussed with ortho who did not recommend modification of d/c plan. -Continue supportive care -Lower extremity Dopplers-negative -Pain control, bowel regimen  Mild transaminitis -Resolved.  Continue monitor - negative hep b surface ag and negative hepatitis c ab  Procedures: LE [place] Summary: Right: There is no evidence of deep vein thrombosis in the lower extremity. However, portions of this examination were limited- see technical findings listed above. Left: There is no evidence of Darek Eifler common femoral vein obstruction.  Consultations:  ortho  Discharge Exam: Vitals:   04/14/19 0543 04/14/19 1324  BP: 104/63 106/67  Pulse: 69 66  Resp: 18 15  Temp: 99.1 F (37.3 C) 98.3 F (36.8 C)  SpO2: 95% 100%   Pain is ok Improved  Has some discomfort when up and about  General: No acute distress. Cardiovascular: Heart sounds show Milik Gilreath regular rate, and rhythm. Lungs: Clear to auscultation bilaterally  Abdomen: Soft, nontender, nondistended Neurological: Alert and oriented 3. Moves all extremities 4 . Cranial nerves II through XII grossly intact. Skin: Warm and dry. No rashes or lesions. Extremities: RLE with ulceration and edema and mild erythema - discussed with orthopedics who reviewed imaging and said it appeared similar to yesterday's exam Psychiatric: Mood and affect are normal. Insight and judgment are appropriate.      Discharge Instructions   Discharge Instructions    Call  MD for:  difficulty breathing, headache or visual disturbances   Complete by: As directed    Call MD for:  extreme fatigue   Complete by: As directed    Call MD for:  hives   Complete by: As directed    Call MD for:  persistant dizziness or light-headedness   Complete by: As  directed    Call MD for:  persistant nausea and vomiting   Complete by: As directed    Call MD for:  redness, tenderness, or signs of infection (pain, swelling, redness, odor or green/yellow discharge around incision site)   Complete by: As directed    Call MD for:  severe uncontrolled pain   Complete by: As directed    Call MD for:  temperature >100.4   Complete by: As directed    Diet - low sodium heart healthy   Complete by: As directed    Discharge instructions   Complete by: As directed    You were seen for cellulitis and Katori Wirsing deep tissue infection of your right leg.  This has improved with IV antibiotics.  We're going to send you home on oral antibiotics for an additional 4 days.  Outpatient follow up and wound care is extremely important.    Continue dressing changes at home.  You'll need twice daily wet to dry dressing changes.  You'll need follow up with PCP and wound care center outpatient.   Return for new, recurrent, or worsening symptoms.  Please ask your PCP to request records from this hospitalization so they know what was done and what the next steps will be.   Discharge wound care:   Complete by: As directed    Discharge wound care:   Complete by: As directed    Wet to dry dressing changes twice daily   Increase activity slowly   Complete by: As directed      Allergies as of 04/14/2019   No Known Allergies     Medication List    TAKE these medications   amoxicillin-clavulanate 875-125 MG tablet Commonly known as: AUGMENTIN Take 1 tablet by mouth every 12 (twelve) hours for 4 days.   doxycycline 100 MG tablet Commonly known as: VIBRA-TABS Take 1 tablet (100 mg total) by mouth every 12 (twelve) hours for 4 days.   OVER THE COUNTER MEDICATION Take 2 tablets by mouth 2 (two) times Leonid Manus day. Tablets sold at the [employer] that contain Diclofenac and B vitamins.   RA GUMMY VITAMINS & MINERALS PO Take 2 tablets by mouth 2 (two) times Alvah Lagrow day.             Discharge Care Instructions  (From admission, onward)         Start     Ordered   04/14/19 0000  Discharge wound care:     04/14/19 1451   04/14/19 0000  Discharge wound care:    Comments: Wet to dry dressing changes twice daily   04/14/19 1452         No Known Allergies    The results of significant diagnostics from this hospitalization (including imaging, microbiology, ancillary and laboratory) are listed below for reference.    Significant Diagnostic Studies: Dg Tibia/fibula Right  Result Date: 04/08/2019 CLINICAL DATA:  Pain and swelling. EXAM: RIGHT TIBIA AND FIBULA - 2 VIEW COMPARISON:  None. FINDINGS: Advanced degenerative changes at the knee joint are noted. Evidence of remote fractures of the tibia and fibula with prior hardware removal. No acute bony findings,  destructive bony changes or radiopaque foreign body. IMPRESSION: Degenerative changes and evidence of remote trauma and surgery. No acute findings. Electronically Signed   By: Rudie Meyer M.D.   On: 04/08/2019 13:56   Mr Tibia Fibula Right W Wo Contrast  Result Date: 04/08/2019 CLINICAL DATA:  Lower leg redness and swelling after spider bite. EXAM: MRI OF LOWER RIGHT EXTREMITY WITHOUT AND WITH CONTRAST TECHNIQUE: Multiplanar, multisequence MR imaging of the right tibia and fibula was performed both before and after administration of intravenous contrast. CONTRAST:  6 mL Gadavist intravenous contrast. COMPARISON:  Right tibia and fibula x-rays from same day. FINDINGS: Bones/Joint/Cartilage No suspicious marrow signal abnormality. Advanced bilateral knee osteoarthritis, most severe in the right lateral compartment, with prominent subchondral marrow edema. Multiple intra-articular bodies in the posterior right knee joint space. No acute fracture or dislocation. Old healed fractures of the right proximal tibial metaphysis and proximal fibular diaphysis. Normal alignment. Small left and trace right knee joint effusions.  Muscles and Tendons Edema and enhancement primarily involving the medial gastrocnemius muscle, and to lesser extent the lateral gastrocnemius muscle and anterior aspect of the soleus muscle. Prominent inter fascial fluid between the medial gastrocnemius and soleus muscles extending superiorly into the popliteal fossa. Soft tissue Extensive diffuse soft tissue swelling and enhancement of the lower leg. There are patchy areas of non-enhancement along the medial distal lower leg (series 10, image 41), consistent with devitalized tissue. No discrete rim enhancing fluid collection. No soft tissue mass. IMPRESSION: 1. Extensive cellulitis of the right lower leg with underlying myofasciitis involving the superficial posterior compartment. Fascial fluid extends superiorly into the popliteal fossa. Areas of devitalized superficial soft tissue along the medial lower leg without discrete abscess. 2. No evidence of osteomyelitis. 3. Advanced bilateral knee osteoarthritis. Electronically Signed   By: Obie Dredge M.D.   On: 04/08/2019 17:44   Vas [place] Lower Extremity Venous (dvt) (only Mc & Wl)  Result Date: 04/08/2019  Lower Venous Study Indications: Right sided Swelling, Erythema, and Post spider bite 5 days ago. Necrotic tissue in the calf area.  Limitations: Open wound. Comparison Study: No previous exam available Performing Technologist: Toma Deiters RVS  Examination Guidelines: Aeisha Minarik complete evaluation includes B-mode imaging, spectral Doppler, color Doppler, and power Doppler as needed of all accessible portions of each vessel. Bilateral testing is considered an integral part of Sakshi Sermons complete examination. Limited examinations for reoccurring indications may be performed as noted.  +---------+---------------+---------+-----------+----------+-------------------+  RIGHT     Compressibility Phasicity Spontaneity Properties Summary               +---------+---------------+---------+-----------+----------+-------------------+  CFV       Full            Yes       Yes                                         +---------+---------------+---------+-----------+----------+-------------------+  SFJ       Full                                                                  +---------+---------------+---------+-----------+----------+-------------------+  FV Prox   Full  Yes       Yes                                         +---------+---------------+---------+-----------+----------+-------------------+  FV Mid    Full                                                                  +---------+---------------+---------+-----------+----------+-------------------+  FV Distal Full            Yes       Yes                                         +---------+---------------+---------+-----------+----------+-------------------+  PFV       Full            Yes       Yes                                         +---------+---------------+---------+-----------+----------+-------------------+  POP       Full            Yes       Yes                                         +---------+---------------+---------+-----------+----------+-------------------+  PTV       Full                                                                  +---------+---------------+---------+-----------+----------+-------------------+  PERO      Full                                             Unable to visualize                                                              the proximal region  +---------+---------------+---------+-----------+----------+-------------------+   Right Technical Findings: Not visualized segments include proximal PTV and proximal Peroneal. Unable to visualize due to open wound in the mid calf at the site of the necrotic tissue.  +----+---------------+---------+-----------+----------+-------+  LEFT Compressibility Phasicity Spontaneity Properties Summary   +----+---------------+---------+-----------+----------+-------+  CFV  Full            Yes       Yes                             +----+---------------+---------+-----------+----------+-------+  SFJ  Full                                                      +----+---------------+---------+-----------+----------+-------+     Summary: Right: There is no evidence of deep vein thrombosis in the lower extremity. However, portions of this examination were limited- see technical findings listed above. Left: There is no evidence of Jalan Fariss common femoral vein obstruction.  *See table(s) above for measurements and observations. Electronically signed by Brandon Cain MD on 04/08/2019 at 9:36:41 PM.    Final     Microbiology: Recent Results (from the past 240 hour(s))  Blood culture (routine x 2)     Status: None   Collection Time: 04/08/19  1:07 PM   Specimen: BLOOD  Result Value Ref Range Status   Specimen Description   Final    BLOOD RIGHT ARM Performed at [facility], [address] [place], [place] [address]    Special Lemar Livingsequests   Final    BOTTLES DRAWN AEROBIC AND ANAEROBIC Blood Culture adequate volume Performed at [facility], [address] [place], [place] [address]    Culture   Final    NO GROWTH 5 DAYS Performed at [facility], [address] [place], [place] [address]    Report Status 04/13/2019 FINAL  Final  Blood culture (routine x 2)     Status: None   Collection Time: 04/08/19  2:39 PM   Specimen: BLOOD RIGHT FOREARM  Result Value Ref Range Status   Specimen Description   Final    BLOOD RIGHT FOREARM Performed at [facility], [address] [place], [place] [address]    Special Requests   Final    BOTTLES DRAWN AEROBIC AND ANAEROBIC Blood Culture adequate volume Performed at [facility], [address] [place], [place] [address]    Culture   Final    NO GROWTH 5 DAYS Performed at [facility],  [address] [place], [place] [address]    Report Status 04/13/2019 FINAL  Final  SARS Coronavirus 2 (CEPHEID - Performed in [facility] hospital lab), Hosp Order     Status: None   Collection Time: 04/08/19  6:04 PM   Specimen: Nasopharyngeal Swab  Result Value Ref Range Status   SARS Coronavirus 2 NEGATIVE NEGATIVE Final    Comment: (NOTE) If result is NEGATIVE SARS-CoV-2 target nucleic acids are NOT DETECTED. The SARS-CoV-2 RNA is generally detectable in upper and lower  respiratory specimens during the acute phase of infection. The lowest  concentration of SARS-CoV-2 viral copies this assay can detect is 250  copies / mL. Sonali Wivell negative result does not preclude SARS-CoV-2 infection  and should not be used as the sole basis for treatment or other  patient management decisions.  Rashawd Laskaris negative result may occur with  improper specimen collection / handling, submission of specimen other  than nasopharyngeal swab, presence of viral mutation(s) within the  areas targeted by this assay, and inadequate number of viral copies  (<250 copies / mL). Kaileen Bronkema negative result must be combined with clinical  observations, patient history, and epidemiological information. If result is POSITIVE SARS-CoV-2 target nucleic acids are DETECTED. The SARS-CoV-2 RNA is generally detectable in upper and lower  respiratory specimens dur ing the acute phase of  infection.  Positive  results are indicative of active infection with SARS-CoV-2.  Clinical  correlation with patient history and other diagnostic information is  necessary to determine patient infection status.  Positive results do  not rule out bacterial infection or co-infection with other viruses. If result is PRESUMPTIVE POSTIVE SARS-CoV-2 nucleic acids MAY BE PRESENT.   Dorleen Kissel presumptive positive result was obtained on the submitted specimen  and confirmed on repeat testing.  While 2019 novel coronavirus  (SARS-CoV-2) nucleic acids may be present in the submitted  sample  additional confirmatory testing may be necessary for epidemiological  and / or clinical management purposes  to differentiate between  SARS-CoV-2 and other Sarbecovirus currently known to infect humans.  If clinically indicated additional testing with an alternate test  methodology [phone]) is advised. The SARS-CoV-2 RNA is generally  detectable in upper and lower respiratory sp ecimens during the acute  phase of infection. The expected result is Negative. Fact Sheet for Patients:  StrictlyIdeas.no Fact Sheet for Healthcare Providers: BankingDealers.co.za This test is not yet approved or cleared by the [place] FDA and has been authorized for detection and/or diagnosis of SARS-CoV-2 by FDA under an Emergency Use Authorization (EUA).  This EUA will remain in effect (meaning this test can be used) for the duration of the COVID-19 declaration under Section 564(b)(1) of the Act, 21 U.S.C. section 360bbb-3(b)(1), unless the authorization is terminated or revoked sooner. Performed at [facility], [place] [address] [place],  [address]      Labs: Basic Metabolic Panel: Recent Labs  Lab 04/09/19 0329 04/10/19 0237 04/11/19 0337 04/12/19 0613 04/13/19 0422 04/14/19 0400  NA 138  --  139 136 138 137  K 3.6  --  3.5 4.0 3.9 4.0  CL 107  --  105 102 103 106  CO2 22  --  25 24 26 24   GLUCOSE 96  --  86 90 89 82  BUN 23  --  11 13 14 13   CREATININE 0.89 0.81 0.78 0.80 0.77 0.71  CALCIUM 7.6*  --  7.9* 7.8* 8.1* 8.1*  MG  --   --  2.0 2.1 2.3 2.2   Liver Function Tests: Recent Labs  Lab 04/08/19 1437 04/09/19 0329 04/11/19 0337 04/12/19 0613 04/13/19 0422  AST 66* 71* 88* 58* 44*  ALT 61* 57* 82* 70* 61*  ALKPHOS 154* 182* 225* 192* 159*  BILITOT 0.6 0.7 0.8 0.8 0.9  PROT 7.7 6.1* 6.2* 6.5 6.8  ALBUMIN 3.3* 2.5* 2.1* 2.3* 2.4*   No results for input(s): LIPASE, AMYLASE in the last 168  hours. No results for input(s): AMMONIA in the last 168 hours. CBC: Recent Labs  Lab 04/08/19 1437 04/09/19 0329 04/12/19 0613 04/13/19 0422 04/14/19 0400  WBC 37.2* 31.6* 14.5* 9.4 10.6*  NEUTROABS 34.6*  --   --   --   --   HGB 13.2 11.1* 10.7* 10.8* 11.4*  HCT 41.2 33.4* 33.8* 34.1* 36.7*  MCV 93.2 93.0 94.9 95.8 96.6  PLT 348 336 611* 696* 698*   Cardiac Enzymes: No results for input(s): CKTOTAL, CKMB, CKMBINDEX, TROPONINI in the last 168 hours. BNP: BNP (last 3 results) No results for input(s): BNP in the last 8760 hours.  ProBNP (last 3 results) No results for input(s): PROBNP in the last 8760 hours.  CBG: No results for input(s): GLUCAP in the last 168 hours.     Signed:  Fayrene Helper MD.  [facility] 04/14/2019, 2:53 PM

## 2019-04-14 NOTE — TOC Initial Note (Signed)
Transition of Care [facility]) - Initial/Assessment Note    Patient Details  Name: Evan Gray MRN: [id number] Date of Birth: 05-15-1956  [facility]) CM/SW Contact:    Servando Snare, LCSW Phone Number: 04/14/2019, 3:36 PM  Clinical Narrative:   LCSW received consult for financial resources. LCSW made referral to financial counseling.   LCSW received message from floor RN stating patient needed home health and assistance with meds. Attending requested PCP and wound care follow up. LCSW arranged [place] [facility], f/u at Patient care center and with wound care clinic. Patient given [facility] letter for meds.  During assessment patient states that he cannot afford $3 copay and will have to ask a friend. Patient reports he has been out of work for disability for 2 months. Patient expressed that he plans to return to [place] in 2 days once dc'd. LCSW asked patient to cancel appointments arranged if he decided to leave the states. Patient expressed under standing.   LCSW notified [facility] of patients wished to return to [place].                 Expected Discharge Plan: [place] Barriers to Discharge: No Barriers Identified   Patient Goals and CMS Choice Patient states their goals for this hospitalization and ongoing recovery are:: return to [place] CMS Medicare.gov Compare Post Acute Care list provided to:: Patient Choice offered to / list presented to : Patient  Expected Discharge Plan and Services Expected Discharge Plan: [place] In-house Referral: NA Discharge Planning Services: NA Post Acute Care Choice: [place] arrangements for the past 2 months: Single Family Home Expected Discharge Date: 04/14/19               DME Arranged: N/A DME Agency: NA       HH Arranged: RN [place] Agency: [facility] Date [place] Agency Contacted: 04/14/19 Time HH Agency Contacted: 0230 Representative spoke with at [place]: Dorian Pod  Prior Living  Arrangements/Services Living arrangements for the past 2 months: [place] Lives with:: Friends Patient language and need for interpreter reviewed:: Yes(Used spanish interpreter) Do you feel safe going back to the place where you live?: Yes      Need for Family Participation in Patient Care: Yes (Comment) Care giver support system in place?: Yes (comment)   Criminal Activity/Legal Involvement Pertinent to Current Situation/Hospitalization: No - Comment as needed  Activities of Daily Living Home Assistive Devices/Equipment: None ADL Screening (condition at time of admission) Patient's cognitive ability adequate to safely complete daily activities?: Yes Is the patient deaf or have difficulty hearing?: No Does the patient have difficulty seeing, even when wearing glasses/contacts?: No Does the patient have difficulty concentrating, remembering, or making decisions?: No Patient able to express need for assistance with ADLs?: Yes Does the patient have difficulty dressing or bathing?: Yes Independently performs ADLs?: No Communication: Independent Dressing (OT): Needs assistance Is this a change from baseline?: Pre-admission baseline Grooming: Independent Is this a change from baseline?: Pre-admission baseline Feeding: Independent Bathing: Independent, Needs assistance Is this a change from baseline?: Pre-admission baseline Toileting: Independent, Needs assistance Is this a change from baseline?: Pre-admission baseline In/Out Bed: Needs assistance Is this a change from baseline?: Pre-admission baseline Walks in Home: Needs assistance Is this a change from baseline?: Pre-admission baseline Does the patient have difficulty walking or climbing stairs?: Yes Weakness of Legs: Both Weakness of Arms/Hands: None  Permission Sought/Granted   Permission granted to  share information with : No              Emotional Assessment Appearance:: Appears stated  age Attitude/Demeanor/Rapport: Engaged Affect (typically observed): Calm Orientation: : Oriented to Self, Oriented to Place, Oriented to  Time, Oriented to Situation Alcohol / Substance Use: Not Applicable Psych Involvement: No (comment)  Admission diagnosis:  Myofasciitis [M60.9] Cellulitis of right lower extremity [L03.115] Patient Active Problem List   Diagnosis Date Noted  . Normocytic anemia 04/13/2019  . DJD (degenerative joint disease) 04/13/2019  . Status post skin graft 04/13/2019  . Sepsis (HCC) 04/11/2019  . Cellulitis of right lower extremity 04/08/2019  . Myofasciitis 04/08/2019  . Elevated liver enzymes 04/08/2019   PCP:  Patient, No Pcp Per Pharmacy:   [id number] [place],  - [address] AT [facility] [address] [place] [address] Phone: [phone] Fax: [phone]     Social Determinants of Health (SDOH) Interventions    Readmission Risk Interventions No flowsheet data found.

## 2019-04-14 NOTE — Plan of Care (Signed)
Pt was discharged home today. Instructions were reviewed with patient, and questions were answered. Pt was taken to main entrance via wheelchair by NT.  

## 2019-04-16 NOTE — TOC Progression Note (Signed)
Transition of Care [facility]) - Progression Note    Patient Details  Name: Garrit Marrow MRN: [id number] Date of Birth: 01/25/1956  Transition of Care [facility]) CM/SW Contact  Servando Snare, LCSW Phone Number: 04/16/2019, 10:10 AM  Clinical Narrative:   Pt has dc'd. LCSW received call from Dorian Pod at [facility] stating that patient declined home health.     Expected Discharge Plan: [place] Barriers to Discharge: No Barriers Identified  Expected Discharge Plan and Services Expected Discharge Plan: [place] In-house Referral: NA Discharge Planning Services: NA Post Acute Care Choice: [place] arrangements for the past 2 months: Single Family Home Expected Discharge Date: 04/14/19               DME Arranged: N/A DME Agency: NA       HH Arranged: RN [place] Agency: [facility] Date [place] Agency Contacted: 04/14/19 Time HH Agency Contacted: 0230 Representative spoke with at [place]: [place] ([place]) Interventions    Readmission Risk Interventions No flowsheet data found.

## 2019-04-28 ENCOUNTER — Encounter (HOSPITAL_BASED_OUTPATIENT_CLINIC_OR_DEPARTMENT_OTHER): Payer: MEDICAID | Attending: Physician Assistant

## 2019-04-29 ENCOUNTER — Ambulatory Visit: Payer: Self-pay | Admitting: Family Medicine

## 2020-09-09 IMAGING — MR MRI OF LOWER RIGHT EXTREMITY WITHOUT AND WITH CONTRAST
4 of 8 series · 23 of 40 positions shown · IV contrast (gadavist)
Comparison: Right tibia and fibula x-rays from same day.

CLINICAL DATA: Lower leg redness and swelling after spider bite.

EXAM:
MRI OF LOWER RIGHT EXTREMITY WITHOUT AND WITH CONTRAST
TECHNIQUE: Multiplanar, multisequence MR imaging of the right tibia and fibula
was performed both before and after administration of intravenous
contrast.
CONTRAST:  6 mL Gadavist intravenous contrast.

[Series 3: T1 · axial · 5.0mm · 0.43mm/px · z∈[-251,+169]mm · 7 of 61 slices shown (1 of 2)]
[im 1/61]
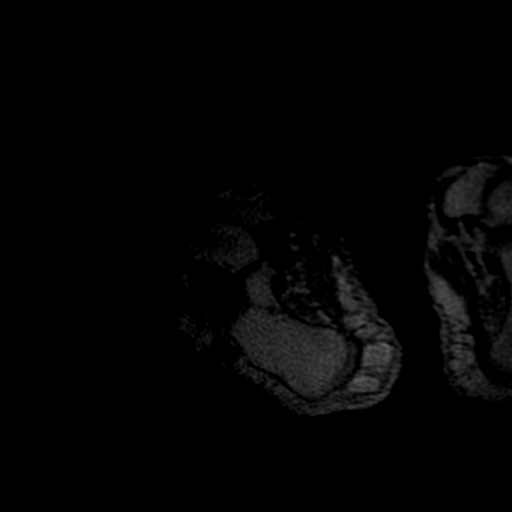
[im 11/61]
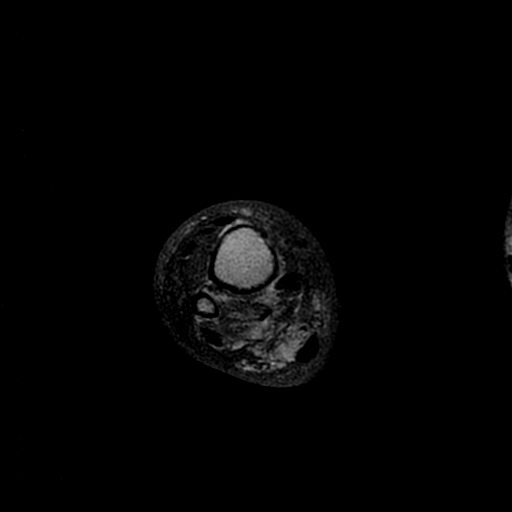
[im 21/61]
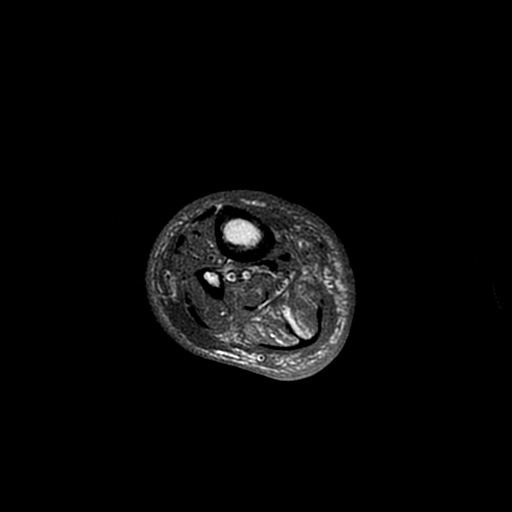
[im 31/61]
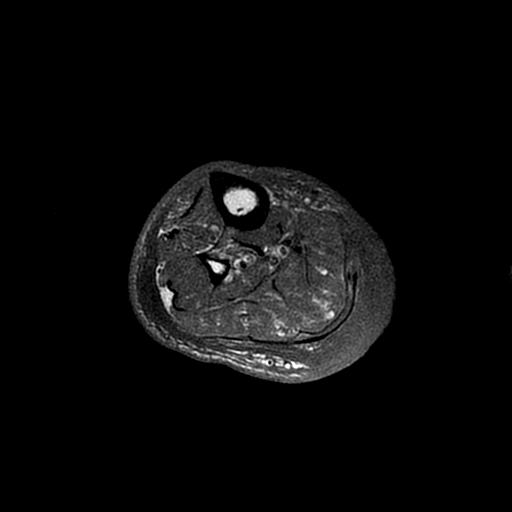
[im 41/61]
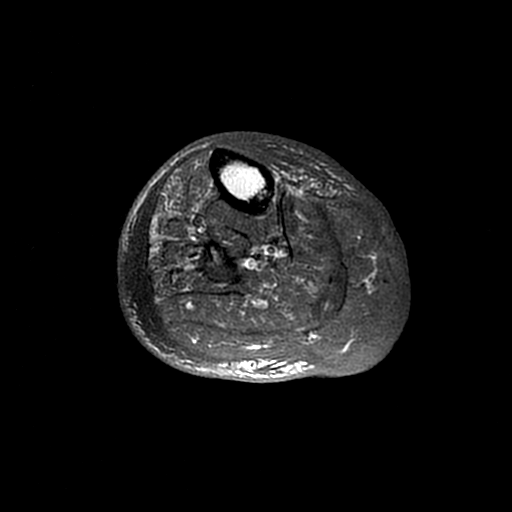
[im 51/61]
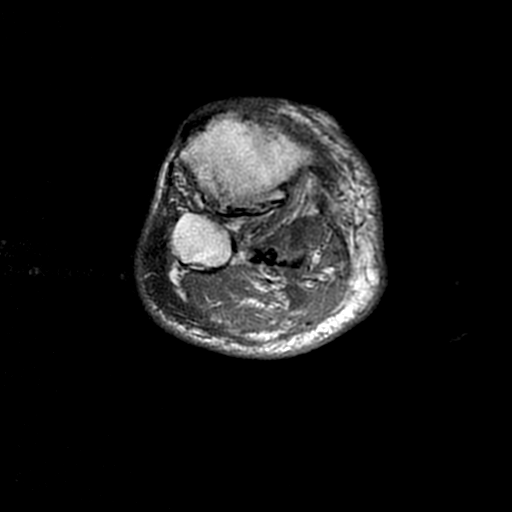
[im 61/61]
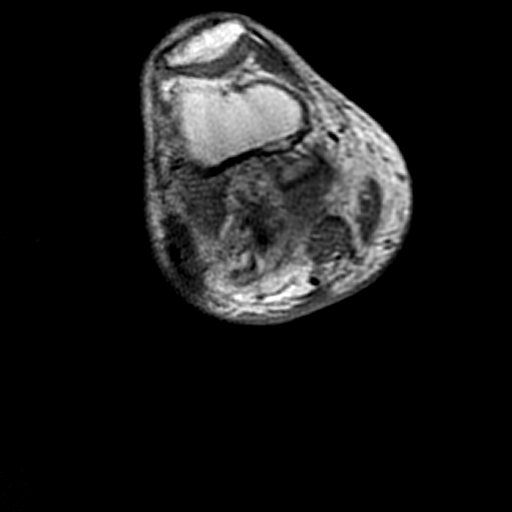

[Series 4: T2 fat-sat · axial · 5.0mm · 0.43mm/px · z∈[-251,+169]mm · 7 of 61 slices shown]
[im 1/61]
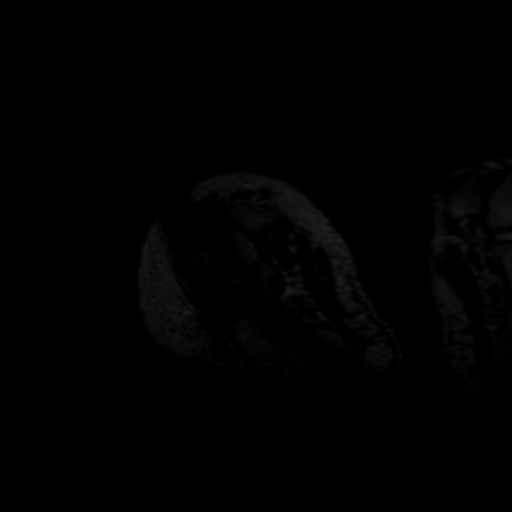
[im 11/61]
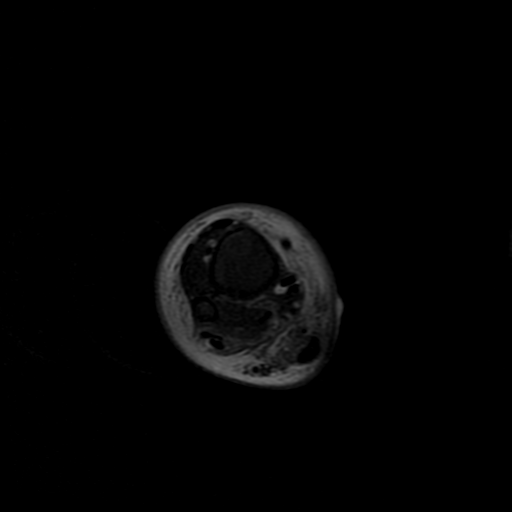
[im 21/61]
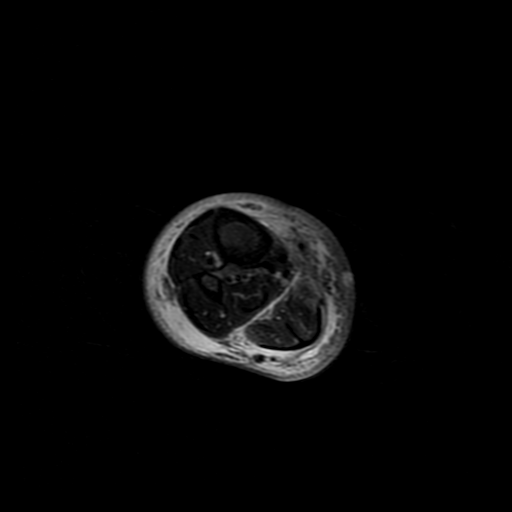
[im 31/61]
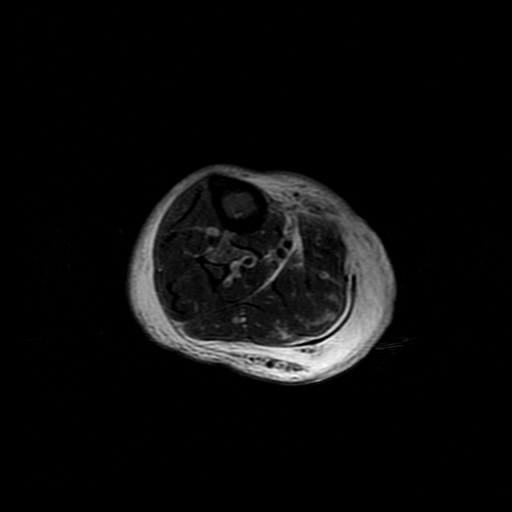
[im 41/61]
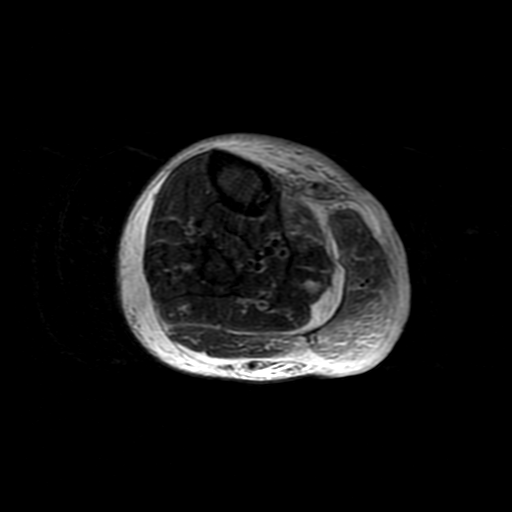
[im 51/61]
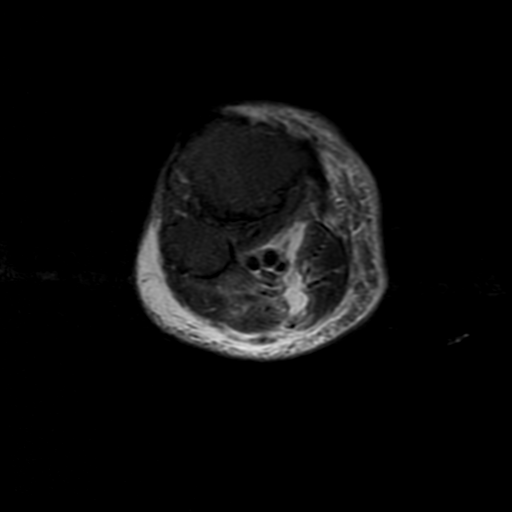
[im 61/61]
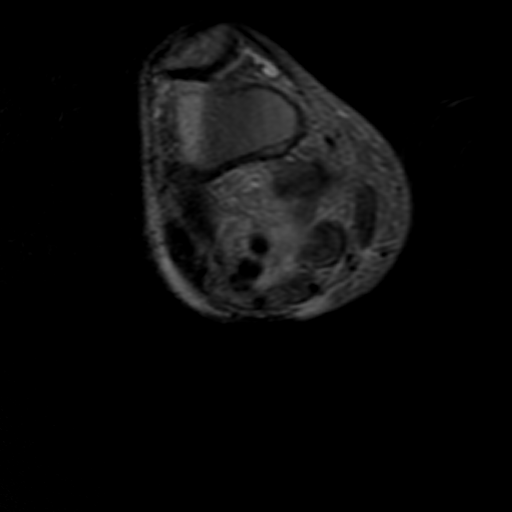

[Series 5: T1 · coronal · 4.0mm · 0.82mm/px · 3 of 25 slices shown (2 of 2)]
[im 1/25]
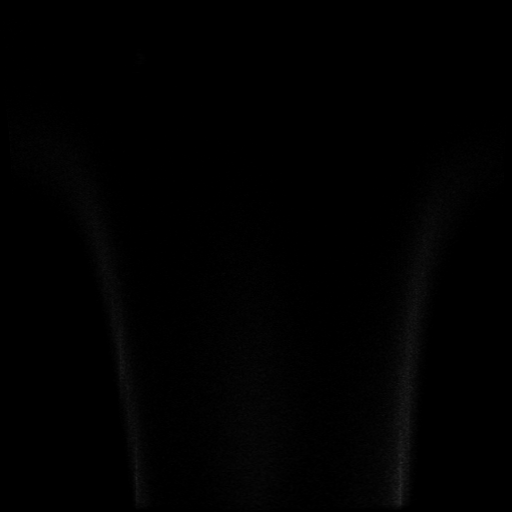
[im 13/25]
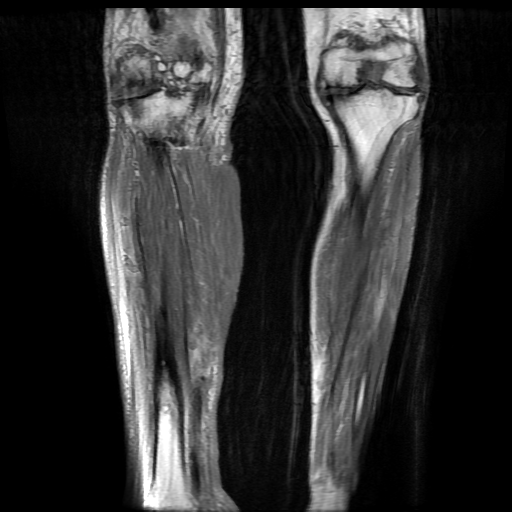
[im 25/25]
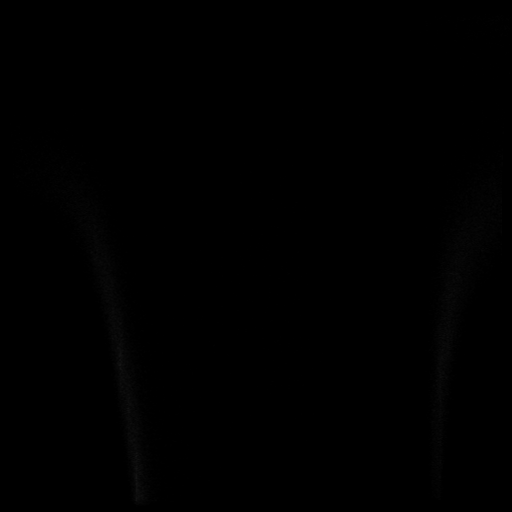

[Series 7: T1 fat-sat · axial · 5.0mm · 0.86mm/px · z∈[-251,+99]mm · 6 of 61 slices shown]
[im 1/61]
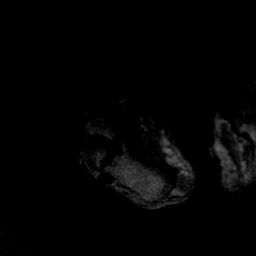
[im 11/61]
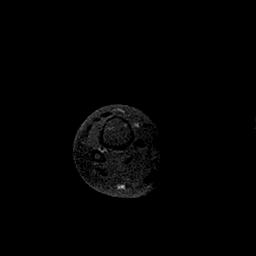
[im 21/61]
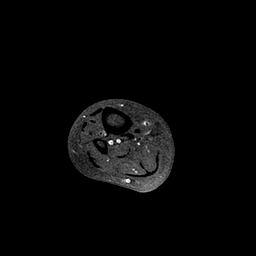
[im 31/61]
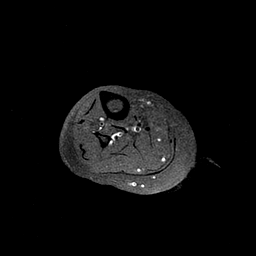
[im 41/61]
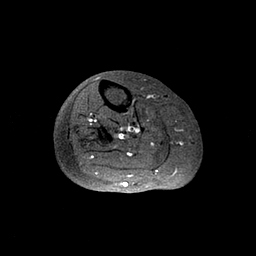
[im 51/61]
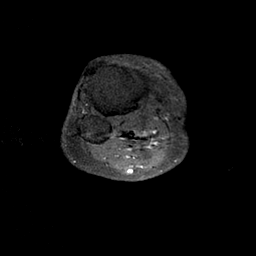

[23 of 40 positions shown; findings below may reference images not displayed]

FINDINGS: Bones/Joint/Cartilage

No suspicious marrow signal abnormality. Advanced bilateral knee
osteoarthritis, most severe in the right lateral compartment, with
prominent subchondral marrow edema. Multiple intra-articular bodies
in the posterior right knee joint space. No acute fracture or
dislocation. Old healed fractures of the right proximal tibial
metaphysis and proximal fibular diaphysis. Normal alignment. Small
left and trace right knee joint effusions.

Muscles and Tendons
Edema and enhancement primarily involving the medial gastrocnemius
muscle, and to lesser extent the lateral gastrocnemius muscle and
anterior aspect of the soleus muscle. Prominent inter fascial fluid
between the medial gastrocnemius and soleus muscles extending
superiorly into the popliteal fossa.

Soft tissue
Extensive diffuse soft tissue swelling and enhancement of the lower
leg. There are patchy areas of non-enhancement along the medial
distal lower leg (series 10, image 41), consistent with devitalized
tissue. No discrete rim enhancing fluid collection. No soft tissue
mass.
IMPRESSION: 1. Extensive cellulitis of the right lower leg with underlying
myofasciitis involving the superficial posterior compartment.
Fascial fluid extends superiorly into the popliteal fossa. Areas of
devitalized superficial soft tissue along the medial lower leg
without discrete abscess.
2. No evidence of osteomyelitis.
3. Advanced bilateral knee osteoarthritis.
